# Patient Record
Sex: Female | Born: 1965 | Race: White | Hispanic: No | Smoking: Current every day smoker
Health system: Southern US, Community
[De-identification: ages and names within clinical notes are randomized; demographics above are authoritative.]

## PROBLEM LIST (undated history)

## (undated) DIAGNOSIS — Z8719 Personal history of other diseases of the digestive system: Secondary | ICD-10-CM

## (undated) DIAGNOSIS — I10 Essential (primary) hypertension: Secondary | ICD-10-CM

## (undated) DIAGNOSIS — E119 Type 2 diabetes mellitus without complications: Secondary | ICD-10-CM

## (undated) DIAGNOSIS — Z8711 Personal history of peptic ulcer disease: Secondary | ICD-10-CM

## (undated) HISTORY — PX: KNEE ARTHROSCOPY: SHX127

## (undated) HISTORY — PX: TONSILLECTOMY: SUR1361

## (undated) HISTORY — PX: DILATION AND CURETTAGE OF UTERUS: SHX78

---
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
## 1998-09-05 ENCOUNTER — Other Ambulatory Visit: Admission: RE | Admit: 1998-09-05 | Discharge: 1998-09-05 | Payer: Self-pay | Admitting: Gynecology

## 2003-01-14 HISTORY — PX: VAGINAL HYSTERECTOMY: SUR661

## 2007-12-01 ENCOUNTER — Encounter: Admission: RE | Admit: 2007-12-01 | Discharge: 2007-12-01 | Payer: Self-pay | Admitting: Family Medicine

## 2007-12-14 ENCOUNTER — Encounter: Admission: RE | Admit: 2007-12-14 | Discharge: 2007-12-14 | Payer: Self-pay | Admitting: Family Medicine

## 2009-10-16 IMAGING — MG MM DIAGNOSTIC LTD RIGHT
3 series · 3 of 3 positions shown · non-contrast
Comparison: 12/02/2007

CLINICAL DATA: Asymmetry right breast noted on patient's baseline
screening mammogram of 12/01/2007

DIGITAL DIAGNOSTIC RIGHT MAMMOGRAM WITHOUT CAD

[R CC]
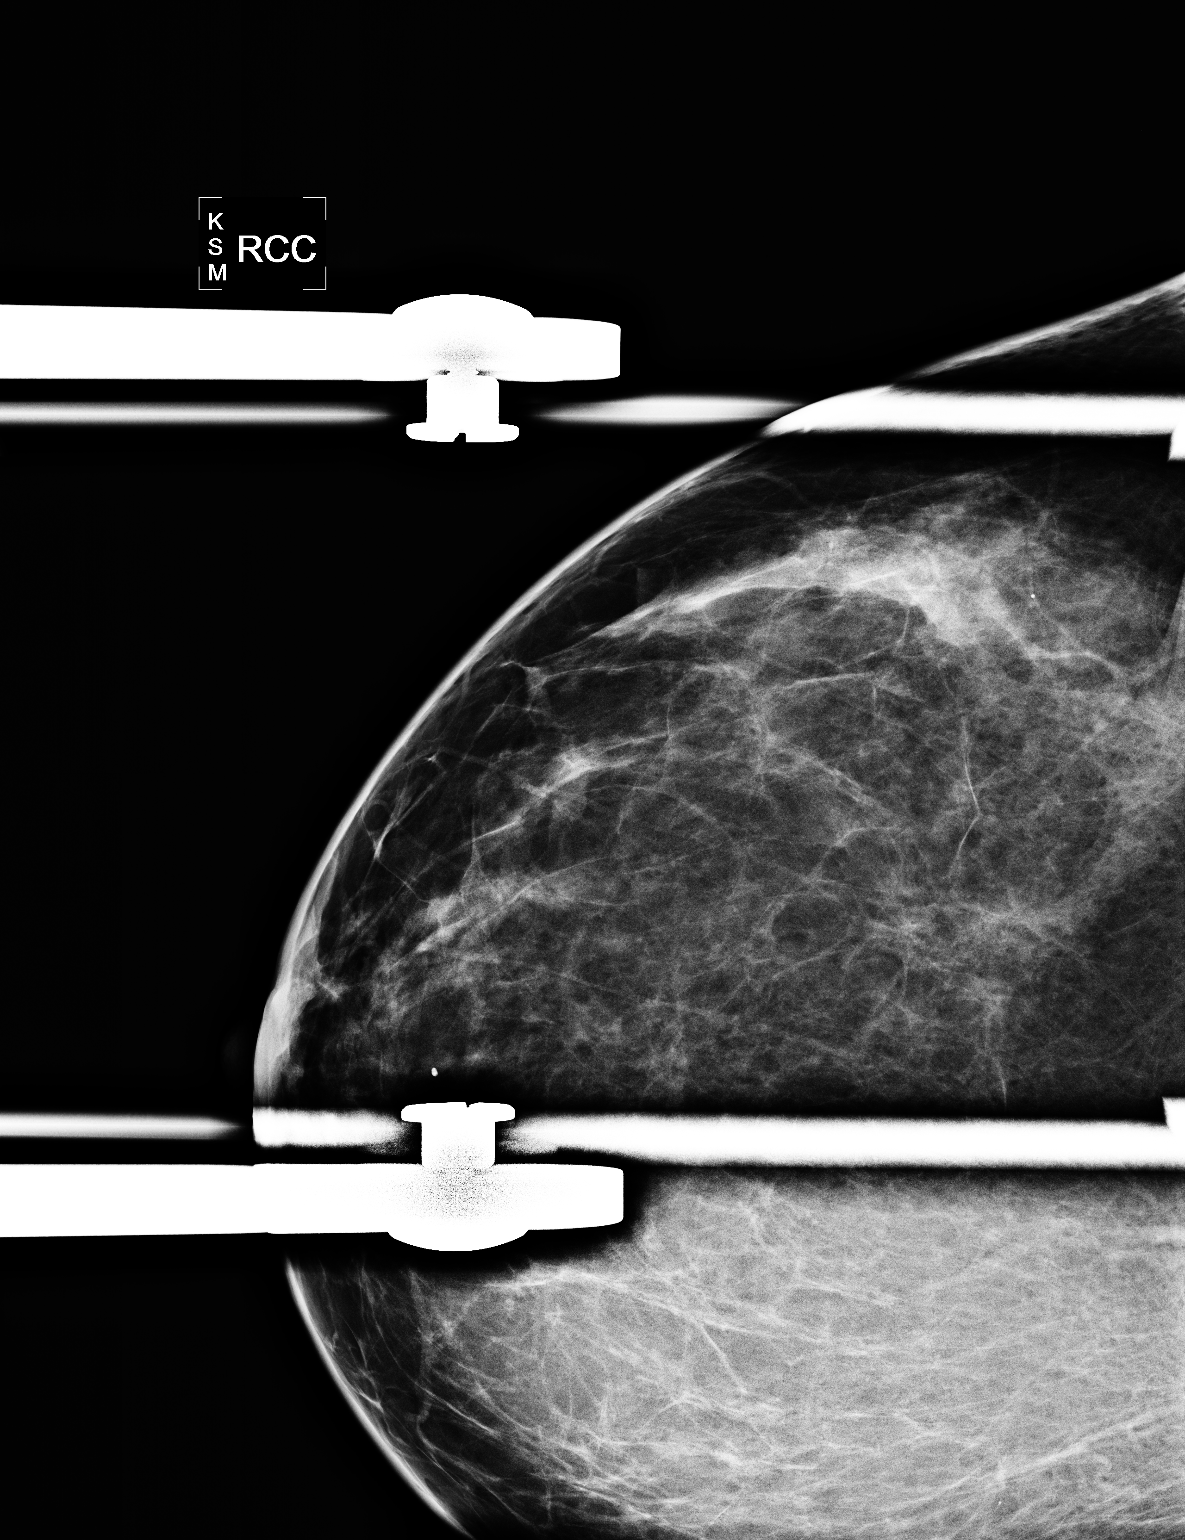

[R MLO]
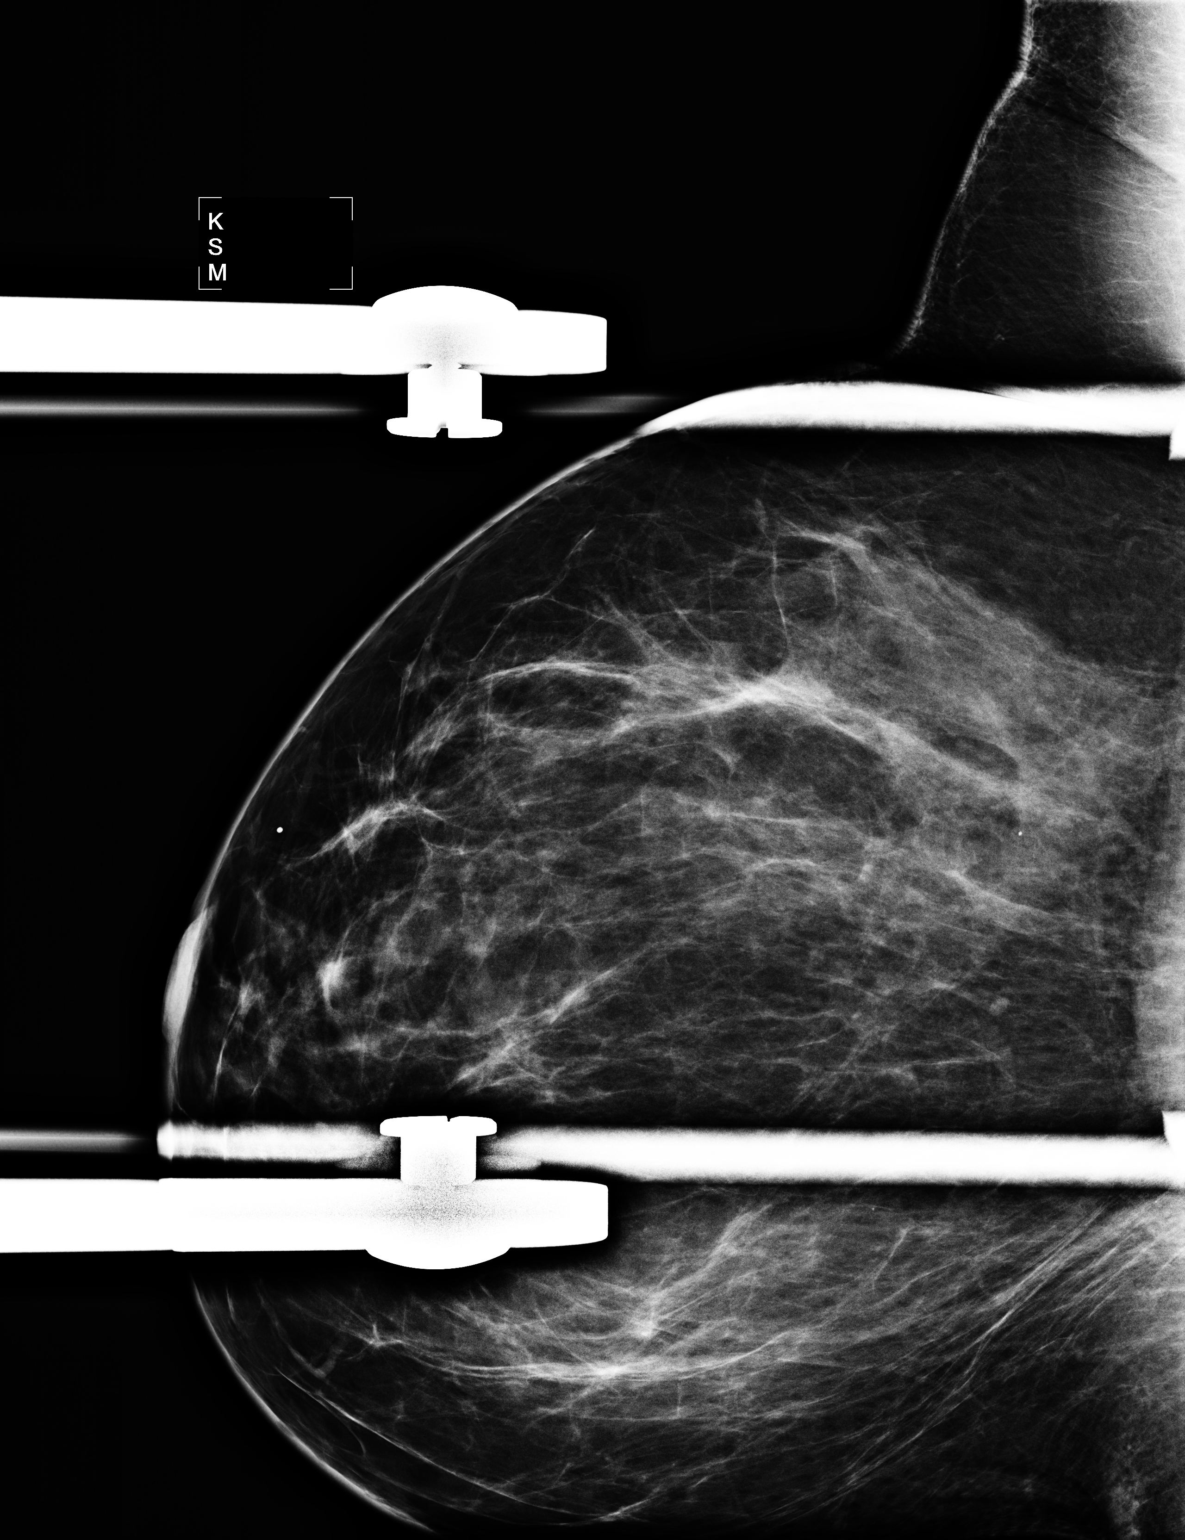

[R ML]
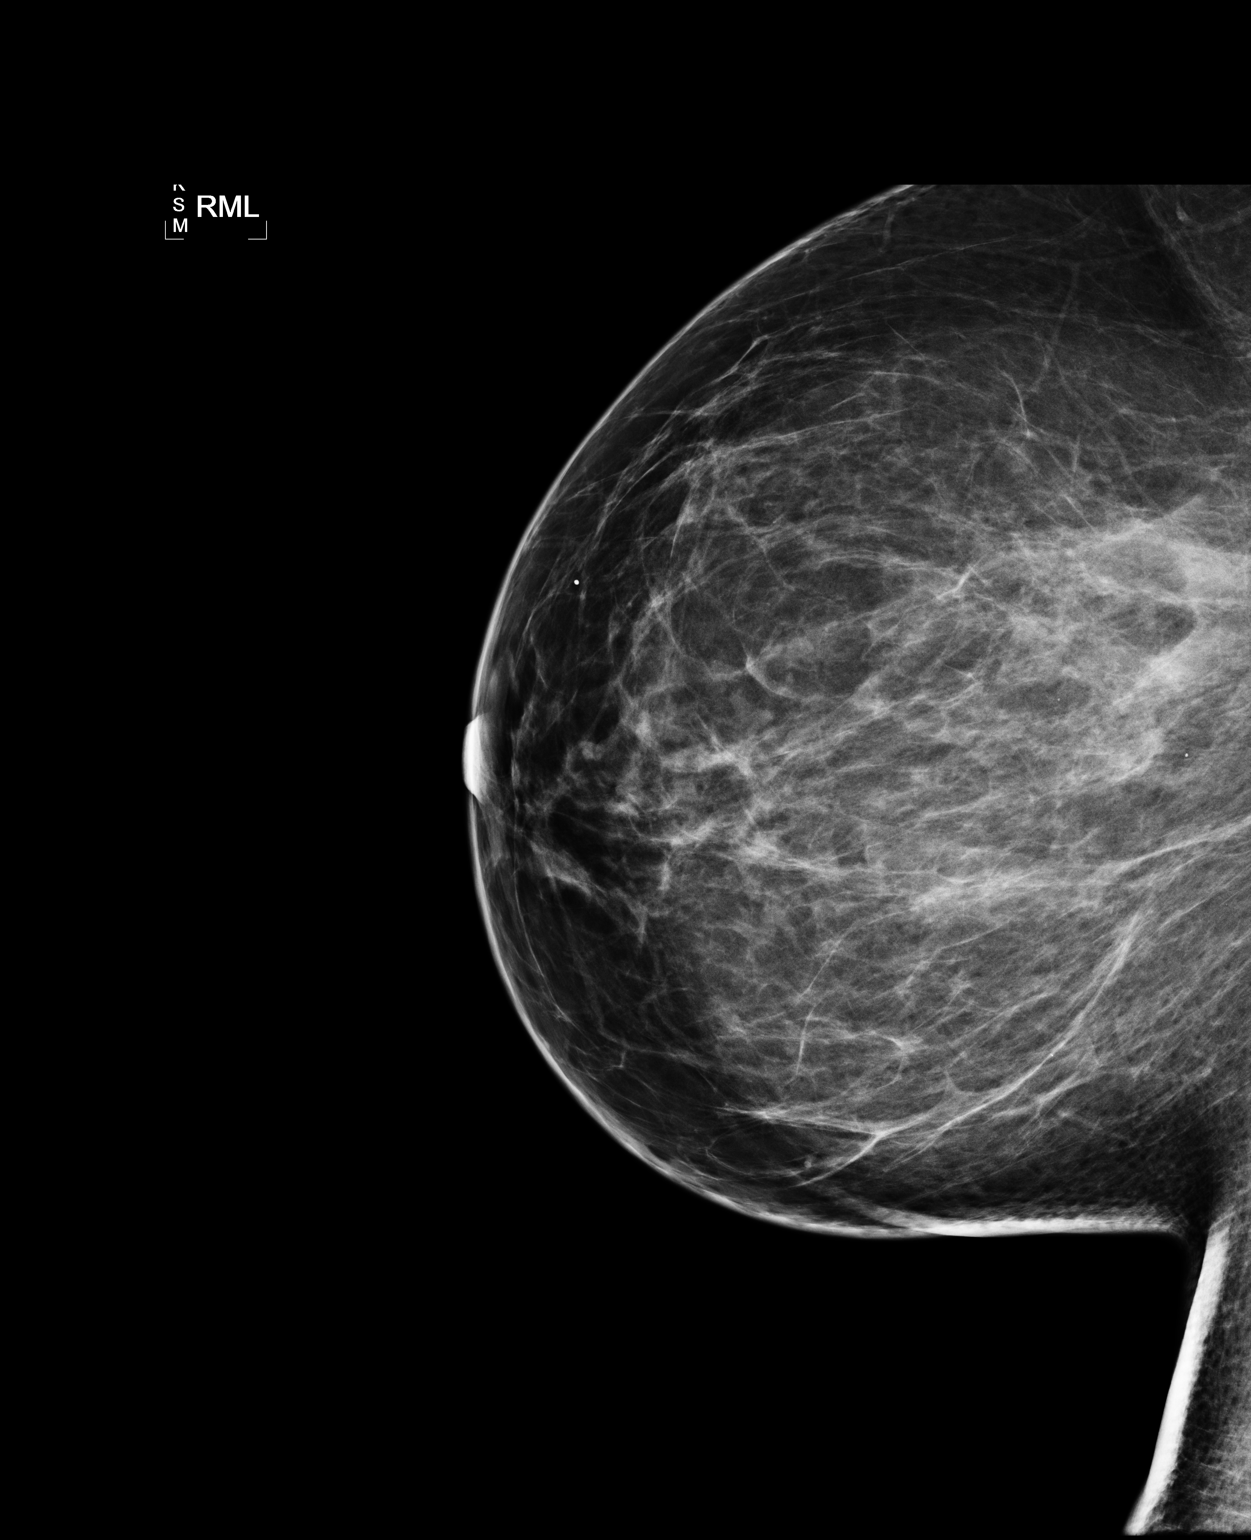

[3 of 3 positions shown; findings below may reference images not displayed]

FINDINGS: A 90 degrees lateral view of the right breast and focal
spot compression views of the right breast show scattered
fibroglandular densities.  There is no evidence of mass or
architectural distortion.  Findings on the screening mammogram are
consistent with overlap of normal fibroglandular breast tissue.
IMPRESSION: No evidence of malignancy in the right breast.  Suggest screening
mammogram of both breasts in 1 year.
BI-RADS CATEGORY 1:  Negative.

## 2010-02-03 ENCOUNTER — Encounter: Payer: Self-pay | Admitting: Family Medicine

## 2014-07-13 ENCOUNTER — Encounter: Payer: Self-pay | Admitting: Physician Assistant

## 2015-04-23 ENCOUNTER — Observation Stay (HOSPITAL_COMMUNITY)
Admission: EM | Admit: 2015-04-23 | Discharge: 2015-04-26 | Disposition: A | Discharge status: Home / Self Care | Payer: BLUE CROSS/BLUE SHIELD | Attending: Internal Medicine | Admitting: Internal Medicine

## 2015-04-23 ENCOUNTER — Emergency Department (HOSPITAL_COMMUNITY): Payer: BLUE CROSS/BLUE SHIELD

## 2015-04-23 ENCOUNTER — Encounter (HOSPITAL_COMMUNITY): Payer: Self-pay | Admitting: Emergency Medicine

## 2015-04-23 DIAGNOSIS — K8010 Calculus of gallbladder with chronic cholecystitis without obstruction: Principal | ICD-10-CM | POA: Insufficient documentation

## 2015-04-23 DIAGNOSIS — Z01818 Encounter for other preprocedural examination: Secondary | ICD-10-CM | POA: Diagnosis present

## 2015-04-23 DIAGNOSIS — I10 Essential (primary) hypertension: Secondary | ICD-10-CM | POA: Insufficient documentation

## 2015-04-23 DIAGNOSIS — R1011 Right upper quadrant pain: Secondary | ICD-10-CM | POA: Diagnosis present

## 2015-04-23 DIAGNOSIS — K8050 Calculus of bile duct without cholangitis or cholecystitis without obstruction: Secondary | ICD-10-CM | POA: Diagnosis present

## 2015-04-23 DIAGNOSIS — Z7984 Long term (current) use of oral hypoglycemic drugs: Secondary | ICD-10-CM | POA: Insufficient documentation

## 2015-04-23 DIAGNOSIS — E876 Hypokalemia: Secondary | ICD-10-CM | POA: Insufficient documentation

## 2015-04-23 DIAGNOSIS — E119 Type 2 diabetes mellitus without complications: Secondary | ICD-10-CM | POA: Insufficient documentation

## 2015-04-23 DIAGNOSIS — K810 Acute cholecystitis: Secondary | ICD-10-CM

## 2015-04-23 DIAGNOSIS — D72829 Elevated white blood cell count, unspecified: Secondary | ICD-10-CM | POA: Diagnosis present

## 2015-04-23 HISTORY — DX: Type 2 diabetes mellitus without complications: E11.9

## 2015-04-23 HISTORY — DX: Personal history of other diseases of the digestive system: Z87.19

## 2015-04-23 HISTORY — DX: Personal history of peptic ulcer disease: Z87.11

## 2015-04-23 HISTORY — DX: Essential (primary) hypertension: I10

## 2015-04-23 LAB — COMPREHENSIVE METABOLIC PANEL
ALBUMIN: 4.3 g/dL (ref 3.5–5.0)
ALK PHOS: 84 U/L (ref 38–126)
ALT: 18 U/L (ref 14–54)
ANION GAP: 13 (ref 5–15)
AST: 17 U/L (ref 15–41)
BUN: 12 mg/dL (ref 6–20)
CALCIUM: 9.8 mg/dL (ref 8.9–10.3)
CHLORIDE: 100 mmol/L — AB (ref 101–111)
CO2: 25 mmol/L (ref 22–32)
Creatinine, Ser: 0.77 mg/dL (ref 0.44–1.00)
GFR calc Af Amer: >60 mL/min (ref >60)
GFR calc non Af Amer: >60 mL/min (ref >60)
GLUCOSE: 177 mg/dL — AB (ref 65–99)
Potassium: 4.4 mmol/L (ref 3.5–5.1)
SODIUM: 138 mmol/L (ref 135–145)
Total Bilirubin: 1 mg/dL (ref 0.3–1.2)
Total Protein: 7.1 g/dL (ref 6.5–8.1)

## 2015-04-23 LAB — CBC
HCT: 41.9 % (ref 36.0–46.0)
HEMOGLOBIN: 14.3 g/dL (ref 12.0–15.0)
MCH: 29.2 pg (ref 26.0–34.0)
MCHC: 34.1 g/dL (ref 30.0–36.0)
MCV: 85.7 fL (ref 78.0–100.0)
Platelets: 250 10*3/uL (ref 150–400)
RBC: 4.89 MIL/uL (ref 3.87–5.11)
RDW: 12.5 % (ref 11.5–15.5)
WBC: 13.1 10*3/uL — AB (ref 4.0–10.5)

## 2015-04-23 LAB — URINE MICROSCOPIC-ADD ON

## 2015-04-23 LAB — URINALYSIS, ROUTINE W REFLEX MICROSCOPIC
Bilirubin Urine: NEGATIVE
GLUCOSE, UA: NEGATIVE mg/dL
Ketones, ur: 15 mg/dL — AB
LEUKOCYTES UA: NEGATIVE
Nitrite: NEGATIVE
Protein, ur: NEGATIVE mg/dL
SPECIFIC GRAVITY, URINE: 1.026 (ref 1.005–1.030)
pH: 5 (ref 5.0–8.0)

## 2015-04-23 LAB — LIPASE, BLOOD: LIPASE: 20 U/L (ref 11–51)

## 2015-04-23 MED ORDER — MORPHINE SULFATE (PF) 4 MG/ML IV SOLN
4.0000 mg | Freq: Once | INTRAVENOUS | Status: DC
  Filled 2015-04-23: qty 1

## 2015-04-23 MED ORDER — OXYCODONE-ACETAMINOPHEN 5-325 MG PO TABS
ORAL_TABLET | ORAL | Status: AC
  Filled 2015-04-23: qty 1

## 2015-04-23 MED ORDER — HYDROMORPHONE HCL 1 MG/ML IJ SOLN
1.0000 mg | Freq: Once | INTRAMUSCULAR | Status: AC
  Administered 2015-04-23: 1 mg via INTRAVENOUS
  Filled 2015-04-23: qty 1

## 2015-04-23 MED ORDER — IOPAMIDOL (ISOVUE-300) INJECTION 61%
INTRAVENOUS | Status: AC
  Filled 2015-04-23: qty 100

## 2015-04-23 MED ORDER — MORPHINE SULFATE (PF) 4 MG/ML IV SOLN
4.0000 mg | Freq: Once | INTRAVENOUS | Status: AC
  Administered 2015-04-23: 4 mg via INTRAMUSCULAR

## 2015-04-23 MED ORDER — OXYCODONE-ACETAMINOPHEN 5-325 MG PO TABS
1.0000 | ORAL_TABLET | ORAL | Status: DC | PRN
  Administered 2015-04-23: 1 via ORAL

## 2015-04-23 MED ORDER — ONDANSETRON HCL 4 MG/2ML IJ SOLN
4.0000 mg | Freq: Once | INTRAMUSCULAR | Status: DC
  Filled 2015-04-23: qty 2

## 2015-04-23 MED ORDER — ONDANSETRON HCL 4 MG/2ML IJ SOLN
4.0000 mg | Freq: Once | INTRAMUSCULAR | Status: AC
  Administered 2015-04-23: 4 mg via INTRAVENOUS
  Filled 2015-04-23: qty 2

## 2015-04-23 MED ORDER — SODIUM CHLORIDE 0.9 % IV BOLUS (SEPSIS)
1000.0000 mL | Freq: Once | INTRAVENOUS | Status: AC
  Administered 2015-04-24: 1000 mL via INTRAVENOUS

## 2015-04-23 MED ORDER — SODIUM CHLORIDE 0.9 % IV SOLN: INTRAVENOUS | Status: DC

## 2015-04-23 MED ORDER — ONDANSETRON 4 MG PO TBDP
4.0000 mg | ORAL_TABLET | Freq: Once | ORAL | Status: AC
  Administered 2015-04-23: 4 mg via ORAL
  Filled 2015-04-23: qty 1

## 2015-04-23 MED ORDER — SODIUM CHLORIDE 0.9 % IV BOLUS (SEPSIS)
1000.0000 mL | Freq: Once | INTRAVENOUS | Status: AC
Start: 2015-04-23 — End: 2015-04-24
  Administered 2015-04-23: 1000 mL via INTRAVENOUS

## 2015-04-23 NOTE — ED Notes (Signed)
Pt was given 8mg  of Zofran at urgent care.

## 2015-04-23 NOTE — ED Provider Notes (Signed)
CSN: [id number]     Arrival date & time 04/23/15  1615 History   First MD Initiated Contact with Patient 04/23/15 2003     Chief Complaint  Patient presents with  . Abdominal Pain     (Consider location/radiation/quality/duration/timing/severity/associated sxs/prior Treatment) HPI   Taylor Gibbs is a 50 y.o. female, with a history of DM and hypertension, presenting to the ED with Right upper quadrant pain that began at 4 AM this morning, accompanied by nausea and vomiting. Patient is sent for instances of emesis since her complaint began. Patient states that she has had a similar pain intermittently for the last couple months, but it would always resolved. Patient denies any noticeable pattern with the previous instances of pain. Patient also endorses subjective fever accompanied by chills. She rates her pain at 10 out of 10, states the pain is hard to describe, and radiates to her right lower back. Patient states that she tried to take Tylenol with Codeine, left over from previous dental procedure, without relief. Patient was first seen at urgent care today and sent here to the ED. Patient denies diarrhea, trauma, urinary complaints, or any other complaints.    Past Medical History  Diagnosis Date  . Diabetes mellitus without complication (HCC)   . Hypertension    Past Surgical History  Procedure Laterality Date  . Abdominal hysterectomy    . Ovarian cyst removal     No family history on file. Social History  Substance Use Topics  . Smoking status: Current Every Day Smoker -- 0.50 packs/day    Types: Cigarettes  . Smokeless tobacco: None  . Alcohol Use: No   OB History    No data available     Review of Systems  Constitutional: Positive for fever (subjective) and chills.  Cardiovascular: Negative for chest pain.  Gastrointestinal: Positive for nausea, vomiting and abdominal pain. Negative for diarrhea, constipation and blood in stool.  Genitourinary: Negative for dysuria,  hematuria, vaginal bleeding and vaginal discharge.  Musculoskeletal: Positive for back pain.  Skin: Negative for color change and pallor.  All other systems reviewed and are negative.     Allergies  Review of patient's allergies indicates no known allergies.  Home Medications   Prior to Admission medications   Medication Sig Start Date End Date Taking? Authorizing Provider  aspirin EC 81 MG tablet Take 81 mg by mouth at bedtime.   Yes Historical Provider, MD  ibuprofen (ADVIL,MOTRIN) 200 MG tablet Take 400 mg by mouth every 6 (six) hours as needed.   Yes Historical Provider, MD  lisinopril-hydrochlorothiazide (PRINZIDE,ZESTORETIC) 20-12.5 MG tablet Take 1 tablet by mouth daily. 04/09/15  Yes Historical Provider, MD  metFORMIN (GLUCOPHAGE) 500 MG tablet Take 500 mg by mouth 2 (two) times daily. 04/09/15  Yes Historical Provider, MD  triamcinolone cream (KENALOG) 0.1 % Apply 1 application topically 2 (two) times daily. 02/21/15  Yes Historical Provider, MD  acetaminophen-codeine (TYLENOL #3) 300-30 MG tablet Take 1 tablet by mouth once.    Historical Provider, MD   BP 150/88 mmHg  Pulse 81  Temp(Src) 98 F (36.7 C) (Oral)  Resp 20  Ht  (1.702 m)  Wt 74.844 kg  BMI 25.84 kg/m2  SpO2 98% Physical Exam  Constitutional: She appears well-developed and well-nourished. No distress.  HENT:  Head: Normocephalic and atraumatic.  Eyes: Conjunctivae are normal. Pupils are equal, round, and reactive to light.  Neck: Neck supple.  Cardiovascular: Normal rate, regular rhythm, normal heart sounds and intact distal  pulses.   Pulmonary/Chest: Effort normal and breath sounds normal. No respiratory distress.  Abdominal: Soft. Normal appearance. There is tenderness in the right upper quadrant. There is no guarding and no CVA tenderness.  Musculoskeletal: She exhibits no edema or tenderness.  Lymphadenopathy:    She has no cervical adenopathy.  Neurological: She is alert.  Skin: Skin is warm and  dry. She is not diaphoretic.  Psychiatric: She has a normal mood and affect. Her behavior is normal.  Nursing note and vitals reviewed.   ED Course  Procedures (including critical care time) Labs Review Labs Reviewed  COMPREHENSIVE METABOLIC PANEL - Abnormal; Notable for the following:    Chloride 100 (*)    Glucose, Bld 177 (*)    All other components within normal limits  CBC - Abnormal; Notable for the following:    WBC 13.1 (*)    All other components within normal limits  URINALYSIS, ROUTINE W REFLEX MICROSCOPIC (NOT AT [facility]) - Abnormal; Notable for the following:    Hgb urine dipstick SMALL (*)    Ketones, ur 15 (*)    All other components within normal limits  URINE MICROSCOPIC-ADD ON - Abnormal; Notable for the following:    Squamous Epithelial / LPF 0-5 (*)    Bacteria, UA RARE (*)    Casts HYALINE CASTS (*)    All other components within normal limits  LIPASE, BLOOD    Imaging Review US Abdomen Limited Ruq  04/23/2015  CLINICAL DATA:  One day history of right upper quadrant pain with vomiting EXAM: US ABDOMEN LIMITED - RIGHT UPPER QUADRANT COMPARISON:  None. FINDINGS: Gallbladder: There is a calculus measuring 1.6 cm in length which is noted in the neck of the gallbladder and appears adherent. This calculus does shadow. There is no gallbladder wall thickening or pericholecystic fluid. No sonographic Murphy sign noted by sonographer. Common bile duct: Diameter: 3 mm. There is no intrahepatic or extrahepatic biliary duct dilatation. Liver: No focal lesion identified. Within normal limits in parenchymal echogenicity. IMPRESSION: Calculus adherent in the neck of the gallbladder. Study otherwise unremarkable. Electronically Signed   By: Bretta Bang III M.D.   On: 04/23/2015 21:36   I have personally reviewed and evaluated these images and lab results as part of my medical decision-making.   EKG Interpretation None       Medications  oxyCODONE-acetaminophen  (PERCOCET/ROXICET) 5-325 MG per tablet 1 tablet (1 tablet Oral Given 04/23/15 1627)  oxyCODONE-acetaminophen (PERCOCET/ROXICET) 5-325 MG per tablet (not administered)  sodium chloride 0.9 % bolus 1,000 mL (1,000 mLs Intravenous New Bag/Given 04/23/15 2211)    And  sodium chloride 0.9 % bolus 1,000 mL (not administered)    And  0.9 %  sodium chloride infusion (not administered)  ondansetron (ZOFRAN-ODT) disintegrating tablet 4 mg (4 mg Oral Given 04/23/15 2259)  morphine 4 MG/ML injection 4 mg (4 mg Intramuscular Given 04/23/15 2253)  HYDROmorphone (DILAUDID) injection 1 mg (1 mg Intravenous Given 04/23/15 2352)  ondansetron (ZOFRAN) injection 4 mg (4 mg Intravenous Given 04/23/15 2352)    MDM   Final diagnoses:  Biliary colic    Birdell E Crunkleton presents with right upper quadrant pain that began at 4 AM this morning, accompanied by nausea and vomiting and subjective fever.  Findings and plan of care discussed with Lyndal Pulley, MD. Dr. Clydene Pugh personally evaluated and examined this patient. This patient was also discussed with Ross Marcus, MD after EDP shift change.   This patient's story and presentation is suspicious  for biliary colic. Labs, imaging, IV fluids, and analgesia ordered. Patient is afebrile here, however, this may be due to her recent Tylenol use. Patient's ultrasound findings suggest that the patient may be suffering from symptomatic cholelithiasis and, combined with her leukocytosis, may indicate an early cholecystitis. Pt has poor IV access. Medications to be given orally and IM. Should patient's pain improved, we will discharge patient with Zofran, oxycodone, and instructions to follow-up with [facility] [place] surgery outpatient. If patient's pain cannot be controlled, contact general surgery for a possible early cholecystitis. Ask them to come evaluate the patient. Upon reassessment, patient states that her pain has not improved. 12:47 AM Patient's pain continues to be  uncontrollable after Percocet, morphine, and Dilaudid. Patient's pain has only reduced from a 10/10 to an 8/10. General surgery consult placed due to the inability to control the patient's symptoms. 12:50 AM General Surgery Paged - Faera Byerly 1:25 AM Dr. Donell BeersByerly repaged 2:06 AM Patient care signed out to Dr. Wilkie AyeHorton.   Filed Vitals:   04/23/15 2000 04/23/15 2015 04/23/15 2045 04/23/15 2215  BP: 165/98 164/99 162/81 156/90  Pulse: 60 62 63 65  Temp:      TempSrc:      Resp: 14 19 17 13   Height:      Weight:      SpO2: 100% 100% 100% 100%   Filed Vitals:   04/24/15 0000 04/24/15 0015 04/24/15 0030 04/24/15 0045  BP: 142/90 143/82 148/89 148/86  Pulse: 83 79 78 77  Temp:      TempSrc:      Resp: 15 14 14 15   Height:      Weight:      SpO2: 92% 97% 97% 96%       Anselm PancoastShawn C Shedric Fredericks, PA-C 04/24/15 0209  Lyndal Pulleyaniel Knott, MD 04/24/15 [phone]

## 2015-04-23 NOTE — ED Notes (Signed)
Patient transported to Ultrasound 

## 2015-04-23 NOTE — ED Notes (Signed)
Pt states she had a sudden onset of RUQ pain at 0400 this am with n/v. Vomiting x4. Pain is also radiating into her mid back.

## 2015-04-23 NOTE — ED Notes (Signed)
2 RNs attempted IV without success. IV team consulted. 

## 2015-04-24 ENCOUNTER — Encounter (HOSPITAL_COMMUNITY)
Admission: EM | Disposition: A | Discharge status: Home / Self Care | Payer: Self-pay | Source: Home / Self Care | Attending: Internal Medicine

## 2015-04-24 ENCOUNTER — Inpatient Hospital Stay (HOSPITAL_COMMUNITY): Payer: BLUE CROSS/BLUE SHIELD

## 2015-04-24 ENCOUNTER — Encounter (HOSPITAL_COMMUNITY): Payer: Self-pay | Admitting: Emergency Medicine

## 2015-04-24 ENCOUNTER — Inpatient Hospital Stay (HOSPITAL_COMMUNITY): Payer: BLUE CROSS/BLUE SHIELD | Admitting: Certified Registered Nurse Anesthetist

## 2015-04-24 DIAGNOSIS — K8050 Calculus of bile duct without cholangitis or cholecystitis without obstruction: Secondary | ICD-10-CM | POA: Diagnosis not present

## 2015-04-24 DIAGNOSIS — E119 Type 2 diabetes mellitus without complications: Secondary | ICD-10-CM | POA: Diagnosis not present

## 2015-04-24 DIAGNOSIS — D72829 Elevated white blood cell count, unspecified: Secondary | ICD-10-CM | POA: Diagnosis not present

## 2015-04-24 DIAGNOSIS — I10 Essential (primary) hypertension: Secondary | ICD-10-CM | POA: Diagnosis not present

## 2015-04-24 HISTORY — PX: LAPAROSCOPIC CHOLECYSTECTOMY: SUR755

## 2015-04-24 HISTORY — PX: CHOLECYSTECTOMY: SHX55

## 2015-04-24 LAB — CBC
HCT: 34.8 % — ABNORMAL LOW (ref 36.0–46.0)
HEMOGLOBIN: 11.7 g/dL — AB (ref 12.0–15.0)
MCH: 28.8 pg (ref 26.0–34.0)
MCHC: 33.6 g/dL (ref 30.0–36.0)
MCV: 85.7 fL (ref 78.0–100.0)
PLATELETS: 178 10*3/uL (ref 150–400)
RBC: 4.06 MIL/uL (ref 3.87–5.11)
RDW: 12.7 % (ref 11.5–15.5)
WBC: 14.8 10*3/uL — ABNORMAL HIGH (ref 4.0–10.5)

## 2015-04-24 LAB — PROTIME-INR
INR: 1.06 (ref 0.00–1.49)
PROTHROMBIN TIME: 14 s (ref 11.6–15.2)

## 2015-04-24 LAB — COMPREHENSIVE METABOLIC PANEL
ALBUMIN: 3 g/dL — AB (ref 3.5–5.0)
ALK PHOS: 61 U/L (ref 38–126)
ALT: 13 U/L — AB (ref 14–54)
ANION GAP: 10 (ref 5–15)
AST: 14 U/L — ABNORMAL LOW (ref 15–41)
BILIRUBIN TOTAL: 0.8 mg/dL (ref 0.3–1.2)
BUN: 8 mg/dL (ref 6–20)
CALCIUM: 7.8 mg/dL — AB (ref 8.9–10.3)
CO2: 22 mmol/L (ref 22–32)
CREATININE: 0.68 mg/dL (ref 0.44–1.00)
Chloride: 107 mmol/L (ref 101–111)
GFR calc Af Amer: >60 mL/min (ref >60)
GFR calc non Af Amer: >60 mL/min (ref >60)
GLUCOSE: 153 mg/dL — AB (ref 65–99)
Potassium: 3.6 mmol/L (ref 3.5–5.1)
SODIUM: 139 mmol/L (ref 135–145)
Total Protein: 5.4 g/dL — ABNORMAL LOW (ref 6.5–8.1)

## 2015-04-24 LAB — GLUCOSE, CAPILLARY
GLUCOSE-CAPILLARY: 148 mg/dL — AB (ref 65–99)
GLUCOSE-CAPILLARY: 152 mg/dL — AB (ref 65–99)
Glucose-Capillary: 112 mg/dL — ABNORMAL HIGH (ref 65–99)
Glucose-Capillary: 129 mg/dL — ABNORMAL HIGH (ref 65–99)

## 2015-04-24 LAB — APTT: aPTT: 32 seconds (ref 24–37)

## 2015-04-24 SURGERY — LAPAROSCOPIC CHOLECYSTECTOMY WITH INTRAOPERATIVE CHOLANGIOGRAM
Anesthesia: General | Site: Abdomen

## 2015-04-24 MED ORDER — FENTANYL CITRATE (PF) 100 MCG/2ML IJ SOLN
INTRAMUSCULAR | Status: DC | PRN
  Administered 2015-04-24 (×2): 50 ug via INTRAVENOUS
  Administered 2015-04-24 (×2): 100 ug via INTRAVENOUS
  Administered 2015-04-24: 50 ug via INTRAVENOUS

## 2015-04-24 MED ORDER — SUGAMMADEX SODIUM 200 MG/2ML IV SOLN
INTRAVENOUS | Status: DC | PRN
  Administered 2015-04-24: 200 mg via INTRAVENOUS

## 2015-04-24 MED ORDER — ROCURONIUM BROMIDE 50 MG/5ML IV SOLN
INTRAVENOUS | Status: AC
  Filled 2015-04-24: qty 1

## 2015-04-24 MED ORDER — KETOROLAC TROMETHAMINE 30 MG/ML IJ SOLN
INTRAMUSCULAR | Status: AC
  Filled 2015-04-24: qty 1

## 2015-04-24 MED ORDER — ONDANSETRON HCL 4 MG/2ML IJ SOLN
INTRAMUSCULAR | Status: AC
  Filled 2015-04-24: qty 2

## 2015-04-24 MED ORDER — HYDROCHLOROTHIAZIDE 12.5 MG PO CAPS
12.5000 mg | ORAL_CAPSULE | Freq: Every day | ORAL | Status: DC
  Administered 2015-04-24 – 2015-04-26 (×2): 12.5 mg via ORAL
  Filled 2015-04-24 (×3): qty 1

## 2015-04-24 MED ORDER — ROCURONIUM BROMIDE 100 MG/10ML IV SOLN
INTRAVENOUS | Status: DC | PRN
  Administered 2015-04-24: 20 mg via INTRAVENOUS
  Administered 2015-04-24: 10 mg via INTRAVENOUS
  Administered 2015-04-24: 20 mg via INTRAVENOUS
  Administered 2015-04-24: 50 mg via INTRAVENOUS

## 2015-04-24 MED ORDER — ONDANSETRON HCL 4 MG/2ML IJ SOLN: 4.0000 mg | Freq: Four times a day (QID) | INTRAMUSCULAR | Status: DC | PRN

## 2015-04-24 MED ORDER — ENOXAPARIN SODIUM 40 MG/0.4ML ~~LOC~~ SOLN
40.0000 mg | SUBCUTANEOUS | Status: DC
  Administered 2015-04-25 – 2015-04-26 (×2): 40 mg via SUBCUTANEOUS
  Filled 2015-04-24 (×2): qty 0.4

## 2015-04-24 MED ORDER — BUPIVACAINE-EPINEPHRINE 0.25% -1:200000 IJ SOLN
INTRAMUSCULAR | Status: DC | PRN
  Administered 2015-04-24: 30 mL

## 2015-04-24 MED ORDER — FENTANYL CITRATE (PF) 250 MCG/5ML IJ SOLN
INTRAMUSCULAR | Status: AC
  Filled 2015-04-24: qty 5

## 2015-04-24 MED ORDER — ONDANSETRON HCL 4 MG PO TABS
4.0000 mg | ORAL_TABLET | Freq: Four times a day (QID) | ORAL | Status: DC | PRN
Start: 2015-04-24 — End: 2015-04-27

## 2015-04-24 MED ORDER — MIDAZOLAM HCL 2 MG/2ML IJ SOLN
INTRAMUSCULAR | Status: AC
  Filled 2015-04-24: qty 2

## 2015-04-24 MED ORDER — HYDROMORPHONE HCL 1 MG/ML IJ SOLN
0.5000 mg | INTRAMUSCULAR | Status: DC | PRN
Start: 2015-04-24 — End: 2015-04-24
  Administered 2015-04-24: 1 mg via INTRAVENOUS
  Filled 2015-04-24: qty 1

## 2015-04-24 MED ORDER — PHENYLEPHRINE 40 MCG/ML (10ML) SYRINGE FOR IV PUSH (FOR BLOOD PRESSURE SUPPORT)
PREFILLED_SYRINGE | INTRAVENOUS | Status: AC
  Filled 2015-04-24: qty 10

## 2015-04-24 MED ORDER — SODIUM CHLORIDE 0.9 % IR SOLN
Status: DC | PRN
  Administered 2015-04-24 (×2): 1000 mL

## 2015-04-24 MED ORDER — DIPHENHYDRAMINE HCL 12.5 MG/5ML PO ELIX: 12.5000 mg | ORAL_SOLUTION | Freq: Four times a day (QID) | ORAL | Status: DC | PRN

## 2015-04-24 MED ORDER — DEXTROSE 5 % IV SOLN
1.0000 g | Freq: Once | INTRAVENOUS | Status: AC
  Administered 2015-04-24: 1 g via INTRAVENOUS
  Filled 2015-04-24: qty 10

## 2015-04-24 MED ORDER — IOPAMIDOL (ISOVUE-300) INJECTION 61%
INTRAVENOUS | Status: DC | PRN
  Administered 2015-04-24: 12 mL

## 2015-04-24 MED ORDER — LACTATED RINGERS IV SOLN
INTRAVENOUS | Status: DC
  Administered 2015-04-24 (×2): via INTRAVENOUS

## 2015-04-24 MED ORDER — LACTATED RINGERS IV SOLN: INTRAVENOUS | Status: DC

## 2015-04-24 MED ORDER — PROPOFOL 10 MG/ML IV BOLUS
INTRAVENOUS | Status: DC | PRN
  Administered 2015-04-24: 170 mg via INTRAVENOUS

## 2015-04-24 MED ORDER — SODIUM CHLORIDE 0.9 % IV SOLN
INTRAVENOUS | Status: DC
  Administered 2015-04-24 – 2015-04-25 (×2): via INTRAVENOUS

## 2015-04-24 MED ORDER — OXYCODONE HCL 5 MG/5ML PO SOLN: 5.0000 mg | Freq: Once | ORAL | Status: DC | PRN

## 2015-04-24 MED ORDER — SENNOSIDES-DOCUSATE SODIUM 8.6-50 MG PO TABS: 1.0000 | ORAL_TABLET | Freq: Every evening | ORAL | Status: DC | PRN

## 2015-04-24 MED ORDER — LIDOCAINE HCL (CARDIAC) 20 MG/ML IV SOLN
INTRAVENOUS | Status: AC
  Filled 2015-04-24: qty 5

## 2015-04-24 MED ORDER — LIDOCAINE HCL (CARDIAC) 20 MG/ML IV SOLN
INTRAVENOUS | Status: DC | PRN
  Administered 2015-04-24: 70 mg via INTRAVENOUS

## 2015-04-24 MED ORDER — DEXTROSE 5 % IV SOLN
1.0000 g | INTRAVENOUS | Status: DC
  Administered 2015-04-24: 1 g via INTRAVENOUS
  Filled 2015-04-24 (×2): qty 10

## 2015-04-24 MED ORDER — MIDAZOLAM HCL 5 MG/5ML IJ SOLN
INTRAMUSCULAR | Status: DC | PRN
  Administered 2015-04-24: 2 mg via INTRAVENOUS

## 2015-04-24 MED ORDER — INSULIN ASPART 100 UNIT/ML ~~LOC~~ SOLN
0.0000 [IU] | Freq: Three times a day (TID) | SUBCUTANEOUS | Status: DC
  Administered 2015-04-24: 3 [IU] via SUBCUTANEOUS
  Administered 2015-04-26 (×2): 2 [IU] via SUBCUTANEOUS

## 2015-04-24 MED ORDER — ACETAMINOPHEN 325 MG PO TABS
650.0000 mg | ORAL_TABLET | Freq: Four times a day (QID) | ORAL | Status: DC | PRN
  Administered 2015-04-26: 650 mg via ORAL
  Filled 2015-04-24: qty 2

## 2015-04-24 MED ORDER — OXYCODONE HCL 5 MG PO TABS
5.0000 mg | ORAL_TABLET | ORAL | Status: DC | PRN
  Administered 2015-04-24 – 2015-04-26 (×7): 10 mg via ORAL
  Filled 2015-04-24 (×7): qty 2

## 2015-04-24 MED ORDER — PANTOPRAZOLE SODIUM 40 MG IV SOLR
40.0000 mg | Freq: Every day | INTRAVENOUS | Status: DC
  Administered 2015-04-24 – 2015-04-25 (×2): 40 mg via INTRAVENOUS
  Filled 2015-04-24 (×2): qty 40

## 2015-04-24 MED ORDER — DEXAMETHASONE SODIUM PHOSPHATE 10 MG/ML IJ SOLN
INTRAMUSCULAR | Status: DC | PRN
  Administered 2015-04-24: 10 mg via INTRAVENOUS

## 2015-04-24 MED ORDER — ONDANSETRON HCL 4 MG/2ML IJ SOLN
4.0000 mg | Freq: Four times a day (QID) | INTRAMUSCULAR | Status: DC | PRN
  Administered 2015-04-24: 4 mg via INTRAVENOUS

## 2015-04-24 MED ORDER — ESMOLOL HCL 100 MG/10ML IV SOLN
INTRAVENOUS | Status: DC | PRN
  Administered 2015-04-24: 40 mg via INTRAVENOUS

## 2015-04-24 MED ORDER — PHENYLEPHRINE HCL 10 MG/ML IJ SOLN
INTRAMUSCULAR | Status: DC | PRN
  Administered 2015-04-24 (×4): 120 ug via INTRAVENOUS

## 2015-04-24 MED ORDER — IOPAMIDOL (ISOVUE-300) INJECTION 61%
INTRAVENOUS | Status: AC
  Filled 2015-04-24: qty 50

## 2015-04-24 MED ORDER — 0.9 % SODIUM CHLORIDE (POUR BTL) OPTIME
TOPICAL | Status: DC | PRN
  Administered 2015-04-24: 1000 mL

## 2015-04-24 MED ORDER — BUPIVACAINE-EPINEPHRINE (PF) 0.25% -1:200000 IJ SOLN
INTRAMUSCULAR | Status: AC
  Filled 2015-04-24: qty 30

## 2015-04-24 MED ORDER — METRONIDAZOLE IN NACL 5-0.79 MG/ML-% IV SOLN
500.0000 mg | Freq: Three times a day (TID) | INTRAVENOUS | Status: DC
  Administered 2015-04-24: 500 mg via INTRAVENOUS
  Filled 2015-04-24 (×4): qty 100

## 2015-04-24 MED ORDER — OXYCODONE HCL 5 MG PO TABS: 5.0000 mg | ORAL_TABLET | Freq: Once | ORAL | Status: DC | PRN

## 2015-04-24 MED ORDER — ACETAMINOPHEN 650 MG RE SUPP: 650.0000 mg | Freq: Four times a day (QID) | RECTAL | Status: DC | PRN

## 2015-04-24 MED ORDER — INSULIN ASPART 100 UNIT/ML ~~LOC~~ SOLN: 0.0000 [IU] | SUBCUTANEOUS | Status: DC

## 2015-04-24 MED ORDER — HYDROMORPHONE HCL 1 MG/ML IJ SOLN: 0.2500 mg | INTRAMUSCULAR | Status: DC | PRN

## 2015-04-24 MED ORDER — ASPIRIN EC 81 MG PO TBEC: 81.0000 mg | DELAYED_RELEASE_TABLET | Freq: Every day | ORAL | Status: DC

## 2015-04-24 MED ORDER — HYDROMORPHONE HCL 1 MG/ML IJ SOLN: 1.0000 mg | INTRAMUSCULAR | Status: DC | PRN

## 2015-04-24 MED ORDER — SUGAMMADEX SODIUM 200 MG/2ML IV SOLN
INTRAVENOUS | Status: AC
Start: 2015-04-24 — End: 2015-04-24
  Filled 2015-04-24: qty 2

## 2015-04-24 MED ORDER — DIPHENHYDRAMINE HCL 50 MG/ML IJ SOLN
12.5000 mg | Freq: Four times a day (QID) | INTRAMUSCULAR | Status: DC | PRN
  Administered 2015-04-25: 12.5 mg via INTRAVENOUS
  Filled 2015-04-24: qty 1

## 2015-04-24 MED ORDER — HYDROMORPHONE HCL 1 MG/ML IJ SOLN
0.5000 mg | INTRAMUSCULAR | Status: DC | PRN
  Administered 2015-04-24 (×2): 1 mg via INTRAVENOUS
  Administered 2015-04-25: 2 mg via INTRAVENOUS
  Administered 2015-04-25 (×3): 1 mg via INTRAVENOUS
  Administered 2015-04-25: 2 mg via INTRAVENOUS
  Administered 2015-04-26: 1 mg via INTRAVENOUS
  Filled 2015-04-24: qty 2
  Filled 2015-04-24 (×6): qty 1
  Filled 2015-04-24: qty 2

## 2015-04-24 MED ORDER — METRONIDAZOLE IN NACL 5-0.79 MG/ML-% IV SOLN
500.0000 mg | Freq: Three times a day (TID) | INTRAVENOUS | Status: AC
Start: 2015-04-24 — End: 2015-04-24
  Administered 2015-04-24: 500 mg via INTRAVENOUS
  Filled 2015-04-24: qty 100

## 2015-04-24 SURGICAL SUPPLY — 50 items
APPLIER CLIP 5 13 M/L LIGAMAX5 (MISCELLANEOUS) ×2
BANDAGE ADH SHEER 1  50/CT (GAUZE/BANDAGES/DRESSINGS) ×6 IMPLANT
BENZOIN TINCTURE PRP APPL 2/3 (GAUZE/BANDAGES/DRESSINGS) ×2 IMPLANT
BLADE SURG ROTATE 9660 (MISCELLANEOUS) IMPLANT
CANISTER SUCTION 2500CC (MISCELLANEOUS) ×2 IMPLANT
CHLORAPREP W/TINT 26ML (MISCELLANEOUS) ×2 IMPLANT
CLIP APPLIE 5 13 M/L LIGAMAX5 (MISCELLANEOUS) ×1 IMPLANT
COVER MAYO STAND STRL (DRAPES) ×2 IMPLANT
COVER SURGICAL LIGHT HANDLE (MISCELLANEOUS) ×2 IMPLANT
DRAPE C-ARM 42X72 X-RAY (DRAPES) ×2 IMPLANT
DRSG TEGADERM 4X4.75 (GAUZE/BANDAGES/DRESSINGS) ×2 IMPLANT
ELECT REM PT RETURN 9FT ADLT (ELECTROSURGICAL) ×2
ELECTRODE REM PT RTRN 9FT ADLT (ELECTROSURGICAL) ×1 IMPLANT
GAUZE SPONGE 2X2 8PLY STRL LF (GAUZE/BANDAGES/DRESSINGS) ×1 IMPLANT
GLOVE BIO SURGEON STRL SZ8 (GLOVE) ×2 IMPLANT
GLOVE BIOGEL M STRL SZ7.5 (GLOVE) ×2 IMPLANT
GLOVE BIOGEL PI IND STRL 7.5 (GLOVE) ×1 IMPLANT
GLOVE BIOGEL PI IND STRL 8 (GLOVE) ×2 IMPLANT
GLOVE BIOGEL PI IND STRL 8.5 (GLOVE) ×1 IMPLANT
GLOVE BIOGEL PI INDICATOR 7.5 (GLOVE) ×1
GLOVE BIOGEL PI INDICATOR 8 (GLOVE) ×2
GLOVE BIOGEL PI INDICATOR 8.5 (GLOVE) ×1
GLOVE SURG SS PI 7.5 STRL IVOR (GLOVE) ×4 IMPLANT
GOWN STRL REUS W/ TWL LRG LVL3 (GOWN DISPOSABLE) ×3 IMPLANT
GOWN STRL REUS W/ TWL XL LVL3 (GOWN DISPOSABLE) ×1 IMPLANT
GOWN STRL REUS W/TWL LRG LVL3 (GOWN DISPOSABLE) ×3
GOWN STRL REUS W/TWL XL LVL3 (GOWN DISPOSABLE) ×1
KIT BASIN OR (CUSTOM PROCEDURE TRAY) ×2 IMPLANT
KIT ROOM TURNOVER OR (KITS) ×2 IMPLANT
NS IRRIG 1000ML POUR BTL (IV SOLUTION) ×2 IMPLANT
PAD ARMBOARD 7.5X6 YLW CONV (MISCELLANEOUS) ×2 IMPLANT
POUCH RETRIEVAL ECOSAC 10 (ENDOMECHANICALS) ×1 IMPLANT
POUCH RETRIEVAL ECOSAC 10MM (ENDOMECHANICALS) ×1
SCISSORS LAP 5X35 DISP (ENDOMECHANICALS) ×2 IMPLANT
SET CHOLANGIOGRAPH 5 50 .035 (SET/KITS/TRAYS/PACK) ×2 IMPLANT
SET IRRIG TUBING LAPAROSCOPIC (IRRIGATION / IRRIGATOR) ×2 IMPLANT
SLEEVE ENDOPATH XCEL 5M (ENDOMECHANICALS) ×4 IMPLANT
SPECIMEN JAR SMALL (MISCELLANEOUS) ×2 IMPLANT
SPONGE GAUZE 2X2 STER 10/PKG (GAUZE/BANDAGES/DRESSINGS) ×1
STRIP CLOSURE SKIN 1/2X4 (GAUZE/BANDAGES/DRESSINGS) ×2 IMPLANT
SUT MNCRL AB 4-0 PS2 18 (SUTURE) ×2 IMPLANT
SUT VIC AB 3-0 SH 27 (SUTURE) ×1
SUT VIC AB 3-0 SH 27XBRD (SUTURE) ×1 IMPLANT
SUT VICRYL 0 UR6 27IN ABS (SUTURE) ×4 IMPLANT
TOWEL OR 17X24 6PK STRL BLUE (TOWEL DISPOSABLE) ×2 IMPLANT
TOWEL OR 17X26 10 PK STRL BLUE (TOWEL DISPOSABLE) ×2 IMPLANT
TRAY LAPAROSCOPIC MC (CUSTOM PROCEDURE TRAY) ×2 IMPLANT
TROCAR XCEL BLUNT TIP 100MML (ENDOMECHANICALS) ×2 IMPLANT
TROCAR XCEL NON-BLD 5MMX100MML (ENDOMECHANICALS) ×2 IMPLANT
TUBING INSUFFLATION (TUBING) ×2 IMPLANT

## 2015-04-24 NOTE — Anesthesia Preprocedure Evaluation (Addendum)
Anesthesia Evaluation  Patient identified by MRN, date of birth, ID band Patient awake    Reviewed: Allergy & Precautions, NPO status , Patient's Chart, lab work & pertinent test results  Airway Mallampati: II   Neck ROM: full    Dental  (+) Dental Advisory Given   Pulmonary Current Smoker,    breath sounds clear to auscultation       Cardiovascular hypertension, Pt. on medications  Rhythm:regular Rate:Normal     Neuro/Psych    GI/Hepatic   Endo/Other  diabetes, Type 2, Oral Hypoglycemic Agents  Renal/GU      Musculoskeletal   Abdominal   Peds  Hematology   Anesthesia Other Findings   Reproductive/Obstetrics                           Anesthesia Physical Anesthesia Plan  ASA: II  Anesthesia Plan: General   Post-op Pain Management:    Induction: Intravenous  Airway Management Planned: Oral ETT  Additional Equipment:   Intra-op Plan:   Post-operative Plan: Extubation in OR  Informed Consent: I have reviewed the patients History and Physical, chart, labs and discussed the procedure including the risks, benefits and alternatives for the proposed anesthesia with the patient or authorized representative who has indicated his/her understanding and acceptance.     Plan Discussed with: CRNA, Anesthesiologist and Surgeon  Anesthesia Plan Comments:         Anesthesia Quick Evaluation

## 2015-04-24 NOTE — Transfer of Care (Signed)
Immediate Anesthesia Transfer of Care Note  Patient: Taylor Gibbs  Procedure(s) Performed: Procedure(s): LAPAROSCOPIC CHOLECYSTECTOMY WITH INTRAOPERATIVE CHOLANGIOGRAM (N/A)  Patient Location: PACU  Anesthesia Type:General  Level of Consciousness: awake, alert , oriented and patient cooperative  Airway & Oxygen Therapy: Patient Spontanous Breathing and Patient connected to nasal cannula oxygen  Post-op Assessment: Report given to RN and Post -op Vital signs reviewed and stable  Post vital signs: Reviewed and stable  Last Vitals:  Filed Vitals:   04/24/15 0956 04/24/15 1410  BP: 134/75 113/67  Pulse: 78 87  Temp: 37.2 C 37.4 C  Resp: 16 16    Complications: No apparent anesthesia complications

## 2015-04-24 NOTE — H&P (Signed)
History and Physical  Patient Name: Taylor Gibbs     JXB:[id number]    DOB: Aug 21, 1965    DOA: 04/23/2015 Referring physician: Harolyn RutherfordShawn Joy, PA-C PCP: Ethel RanaHEPLER,MARK, PA-C      Chief Complaint: Abdominal pain  HPI: Taylor Gibbs is a 50 y.o. female with a past medical history significant for HTN and NIDDM who presents with abdominal pain.  The patient was in her usual state of health until last few days when she felt "not herself" and then the morning before admission woke up at 4 AM from sleep with severe RUQ abdominal pain radiating to the back accompanied by nausea and vomiting.  The pain was "bloating" and indigestion-like, associated with heartburn, and constant throughout the day. She had NBNB emesis several times during the day, and no appetite. The discomfort was not exertional, and not associated with shortness of breath, hematemesis, melena, fever, or chills.  In the ED, the patient was afebrile and slightly hypertensive.  Na 138, K 4.4, Cr 0.8, transaminases and lipase normal, WBC 13.1K, Hgb 14.  A CXR showed normal mediastinum and no airspace opacities and RUQ US showed 1.6 cm gallstone without evidence of cholecystitis.  The case was discussed with Dr. Donell BeersByerly, who recommended medical admission for IV antibiotics and possible lap chole.     Review of Systems:  Pt complains of right upper quadrant pain, nausea and vomiting, indigestion, heartburn, bloating, intermittent dizziness with looking to the left. Pt denies any fever, chills, rashes.  All other systems negative except as just noted or noted in the history of present illness.  No Known Allergies  Prior to Admission medications   Medication Sig Start Date End Date Taking? Authorizing Provider  aspirin EC 81 MG tablet Take 81 mg by mouth at bedtime.   Yes Historical Provider, MD  ibuprofen (ADVIL,MOTRIN) 200 MG tablet Take 400 mg by mouth every 6 (six) hours as needed.   Yes Historical Provider, MD    lisinopril-hydrochlorothiazide (PRINZIDE,ZESTORETIC) 20-12.5 MG tablet Take 1 tablet by mouth daily. 04/09/15  Yes Historical Provider, MD  metFORMIN (GLUCOPHAGE) 500 MG tablet Take 500 mg by mouth 2 (two) times daily. 04/09/15  Yes Historical Provider, MD  triamcinolone cream (KENALOG) 0.1 % Apply 1 application topically 2 (two) times daily. 02/21/15  Yes Historical Provider, MD  acetaminophen-codeine (TYLENOL #3) 300-30 MG tablet Take 1 tablet by mouth once.    Historical Provider, MD    Past Medical History  Diagnosis Date  . Diabetes mellitus without complication (HCC)   . Hypertension     Past Surgical History  Procedure Laterality Date  . Abdominal hysterectomy    . Ovarian cyst removal      Family history: family history includes Diabetes in her father and mother; Gallbladder disease in her father and mother; Heart attack in her father; Hypertension in her father and mother; Stroke in her mother.  Social History: Patient lives With her daughter. She is a Advice workerreal estate office manager. She smokes. She is independent with all ADLs and IADLs.       Physical Exam: BP 150/88 mmHg  Pulse 81  Temp(Src) 98 F (36.7 C) (Oral)  Resp 20  Ht 5\' 7"  (1.702 m)  Wt 74.844 kg (165 lb)  BMI 25.84 kg/m2  SpO2 98% General appearance: Well-developed, adult female, alert and in no acute distress.   Eyes: Anicteric, conjunctiva pink, lids and lashes normal.     ENT: No nasal deformity, discharge, or epistaxis.  OP moist without lesions.  Lymph: No cervical or supraclavicular lymphadenopathy. Skin: Warm and dry.  No jaundice.  Erythema over the eyelids, heliotrope. Cardiac: RRR, nl S1-S2, no murmurs appreciated.  Capillary refill is brisk.  JVP normal.  No LE edema.  Radial and DP pulses 2+ and symmetric. Respiratory: Normal respiratory rate and rhythm.  CTAB without rales or wheezes. Abdomen: Abdomen soft without rigidity.  RUQ TTP, without rebound or HSM. No ascites, distension.   MSK: No  deformities or effusions. Neuro: Sensorium intact and responding to questions, attention normal.  Speech is fluent.  Moves all extremities equally and with normal coordination.    Psych: Behavior appropriate.  Affect normal.  No evidence of aural or visual hallucinations or delusions.       Labs on Admission:  The metabolic panel shows normal electrolytes and renal function. Transaminases and bilirubin are normal. Urinalysis shows hyaline casts without pyuria or hematuria. Lipase normal. The complete blood count shows mild leukocytosis without anemia or thrombocytopenia.   Radiological Exams on Admission: Personally reviewed: Dg Chest 2 View 04/24/2015   IMPRESSION: Mild peribronchial thickening noted.  Lungs otherwise clear.    US Abdomen Limited Ruq 04/23/2015  CLINICAL DATA:  One day history of right upper quadrant pain with vomiting EXAM: US ABDOMEN LIMITED - RIGHT UPPER QUADRANT COMPARISON:  None. FINDINGS: Gallbladder: There is a calculus measuring 1.6 cm in length which is noted in the neck of the gallbladder and appears adherent. This calculus does shadow. There is no gallbladder wall thickening or pericholecystic fluid. No sonographic Murphy sign noted by sonographer. Common bile duct: Diameter: 3 mm. There is no intrahepatic or extrahepatic biliary duct dilatation. Liver: No focal lesion identified. Within normal limits in parenchymal echogenicity. IMPRESSION: Calculus adherent in the neck of the gallbladder. Study otherwise unremarkable. Electronically Signed   By: Bretta Bang III M.D.   On: 04/23/2015 21:36      Assessment/Plan 1. RUQ abdominal pain, suspect biliary colic:  This is new.  Presents with 1 day constant RUQ pain radiating to back associated with bloating, heartburn, and NBNB N/V.  RUQ US shows 1.6cm stone without evidence of gallbladder inflammation.  ECG and troponin normal.  LFTs normal. -NPO -MIVF -Ceftriaxone and metronidazole IV -Hydromorphone and  ondansetron for symptom control -Consult to General Surgery, appreciate cares  From the standpoint of cardiovascular clearance for surgery, the patient is low risk for this intermediate risk procedure.  RCRI 0.  No further testing indicated.   2. Type 2 diabetes:  -Check HgbA1c -Hold metformin while inpaitent -Sliding scale corrections, low dose while NPO  3. HTN:  Slightly hypertensive at admission. -Hold ACEi in case she will undergo surgery -Continue HCTZ  4. Leukocytosis:  Likely from gallstone      DVT PPx: SCDs Diet: NPO Consultants: General Surgery Code Status: FULL Family Communication: None present  Medical decision making: What exists of the patient's previous chart was reviewed in depth and the case was discussed with Harolyn Rutherford and Ross Marcus, MD. Patient seen 2:15 AM on 04/24/2015.  Disposition Plan:  I recommend admission to medical surgical bed, inpatient status.  Clinical condition: stable.  Anticipate MIVF and pain control.  Consultation with General Surgery, disposition pending surgical evaluation.      Alberteen Sam [facility] Pager [phone]

## 2015-04-24 NOTE — Consult Note (Signed)
Reason for Consult:gallstones Referring Physician: Caryl Asp, PA-C  Taylor Gibbs is an 50 y.o. female.  HPI:  Pt is a 50 yo F who presents with pain that awoke her from sleep in the RUQ around 24 hours ago.  The pain was severe and sudden and has not gone away since then.  It has improved and temporarily abated with pain medication, but has not resolved.  She denies fever/chills/v/diarrhea/constipation.  She has had some mild episodes of abdominal pain that have been present at night, but nothing that brought her to medical attention.  Both her parents have had gallbladder removal.  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    Past Medical History  Diagnosis Date  . Diabetes mellitus without complication ([place])   . Hypertension     Past Surgical History  Procedure Laterality Date  . Abdominal hysterectomy    . Ovarian cyst removal      Family History  Problem Relation Age of Onset  . Diabetes Mother   . Hypertension Mother   . Stroke Mother   . Gallbladder disease Mother   . Gallbladder disease Father   . Hypertension Father   . Diabetes Father   . Heart attack Father     Social History:  reports that she has been smoking Cigarettes.  She has been smoking about 0.50 packs per day. She does not have any smokeless tobacco history on file. She reports that she does not drink alcohol. Her drug history is not on file.  Allergies: No Known Allergies  Medications: aspirin EC 81 MG tablet    ibuprofen (ADVIL,MOTRIN) 200 MG tablet   lisinopril-hydrochlorothiazide (PRINZIDE,ZESTORETIC) 20-12.5 MG tablet   metFORMIN (GLUCOPHAGE) 500 MG tablet   triamcinolone cream (KENALOG) 0.1 %   acetaminophen-codeine (TYLENOL #3) 300-30 MG tablet        Results for orders placed or performed during the hospital encounter of 04/23/15 (from the past 48 hour(s))  Urinalysis, Routine w reflex microscopic (not at [facility])     Status: Abnormal   Collection Time: 04/23/15  4:32 PM  Result Value  Ref Range   Color, Urine YELLOW YELLOW   APPearance CLEAR CLEAR   Specific Gravity, Urine 1.026 1.005 - 1.030   pH 5.0 5.0 - 8.0   Glucose, UA NEGATIVE NEGATIVE mg/dL   Hgb urine dipstick SMALL (A) NEGATIVE   Bilirubin Urine NEGATIVE NEGATIVE   Ketones, ur 15 (A) NEGATIVE mg/dL   Protein, ur NEGATIVE NEGATIVE mg/dL   Nitrite NEGATIVE NEGATIVE   Leukocytes, UA NEGATIVE NEGATIVE  Urine microscopic-add on     Status: Abnormal   Collection Time: 04/23/15  4:32 PM  Result Value Ref Range   Squamous Epithelial / LPF 0-5 (A) NONE SEEN   WBC, UA 0-5 0 - 5 WBC/hpf   RBC / HPF 0-5 0 - 5 RBC/hpf   Bacteria, UA RARE (A) NONE SEEN   Casts HYALINE CASTS (A) NEGATIVE   Urine-Other MUCOUS PRESENT   Lipase, blood     Status: None   Collection Time: 04/23/15  5:03 PM  Result Value Ref Range   Lipase 20 11 - 51 U/L  Comprehensive metabolic panel     Status: Abnormal   Collection Time: 04/23/15  5:03 PM  Result Value Ref Range   Sodium 138 135 - 145 mmol/L   Potassium 4.4 3.5 - 5.1 mmol/L   Chloride 100 (L) 101 - 111 mmol/L   CO2 25 22 - 32 mmol/L   Glucose,  Bld 177 (H) 65 - 99 mg/dL   BUN 12 6 - 20 mg/dL   Creatinine, Ser 0.77 0.44 - 1.00 mg/dL   Calcium 9.8 8.9 - 10.3 mg/dL   Total Protein 7.1 6.5 - 8.1 g/dL   Albumin 4.3 3.5 - 5.0 g/dL   AST 17 15 - 41 U/L   ALT 18 14 - 54 U/L   Alkaline Phosphatase 84 38 - 126 U/L   Total Bilirubin 1.0 0.3 - 1.2 mg/dL   GFR calc non Af Amer >60 >60 mL/min   GFR calc Af Amer >60 >60 mL/min    Comment: (NOTE) The eGFR has been calculated using the CKD EPI equation. This calculation has not been validated in all clinical situations. eGFR's persistently <60 mL/min signify possible Chronic Kidney Disease.    Anion gap 13 5 - 15  CBC     Status: Abnormal   Collection Time: 04/23/15  5:03 PM  Result Value Ref Range   WBC 13.1 (H) 4.0 - 10.5 K/uL   RBC 4.89 3.87 - 5.11 MIL/uL   Hemoglobin 14.3 12.0 - 15.0 g/dL   HCT 41.9 36.0 - 46.0 %   MCV 85.7  78.0 - 100.0 fL   MCH 29.2 26.0 - 34.0 pg   MCHC 34.1 30.0 - 36.0 g/dL   RDW 12.5 11.5 - 15.5 %   Platelets 250 150 - 400 K/uL    Dg Chest 2 View  04/24/2015  CLINICAL DATA:  Preoperative chest radiograph. Shortness of breath. Initial encounter. EXAM: CHEST  2 VIEW COMPARISON:  None. FINDINGS: The lungs are well-aerated. Mild peribronchial thickening is noted. There is no evidence of focal opacification, pleural effusion or pneumothorax. The heart is normal in size; the mediastinal contour is within normal limits. No acute osseous abnormalities are seen. IMPRESSION: Mild peribronchial thickening noted.  Lungs otherwise clear. Electronically Signed   By: Garald Balding M.D.   On: 04/24/2015 03:21   US Abdomen Limited Ruq  04/23/2015  CLINICAL DATA:  One day history of right upper quadrant pain with vomiting EXAM: US ABDOMEN LIMITED - RIGHT UPPER QUADRANT COMPARISON:  None. FINDINGS: Gallbladder: There is a calculus measuring 1.6 cm in length which is noted in the neck of the gallbladder and appears adherent. This calculus does shadow. There is no gallbladder wall thickening or pericholecystic fluid. No sonographic Murphy sign noted by sonographer. Common bile duct: Diameter: 3 mm. There is no intrahepatic or extrahepatic biliary duct dilatation. Liver: No focal lesion identified. Within normal limits in parenchymal echogenicity. IMPRESSION: Calculus adherent in the neck of the gallbladder. Study otherwise unremarkable. Electronically Signed   By: Lowella Grip III M.D.   On: 04/23/2015 21:36    Review of Systems  Constitutional: Negative.   HENT: Negative.   Eyes: Negative.   Respiratory: Negative.   Cardiovascular: Negative.   Gastrointestinal: Positive for nausea and abdominal pain.  Genitourinary: Negative.   Musculoskeletal: Negative.   Skin: Negative.   Neurological: Negative.   Endo/Heme/Allergies: Negative.   Psychiatric/Behavioral: Negative.    Blood pressure 134/84, pulse 80,  temperature 98 F (36.7 C), temperature source Oral, resp. rate 18, height 5' 7"  (1.702 m), weight 74.844 kg (165 lb), SpO2 97 %. Physical Exam  Constitutional: She is oriented to person, place, and time. She appears well-developed and well-nourished. She appears distressed (looks uncomfortable).  HENT:  Head: Normocephalic and atraumatic.  Right Ear: External ear normal.  Left Ear: External ear normal.  Eyes: Conjunctivae are normal. Pupils are equal,  round, and reactive to light. Right eye exhibits no discharge. Left eye exhibits no discharge. No scleral icterus.  Neck: Normal range of motion. Neck supple. No tracheal deviation present. No thyromegaly present.  Cardiovascular: Normal rate, regular rhythm, normal heart sounds and intact distal pulses.   No murmur heard. Respiratory: Effort normal and breath sounds normal. No respiratory distress. She has no wheezes. She has no rales. She exhibits no tenderness.  GI: Soft. She exhibits no distension and no mass. There is tenderness. There is no rebound (RUQ) and no guarding.  Musculoskeletal: Normal range of motion. She exhibits no edema or tenderness.  Neurological: She is alert and oriented to person, place, and time. Coordination normal.  Skin: Skin is warm and dry. No rash noted. She is not diaphoretic. No erythema. No pallor.  Psychiatric: She has a normal mood and affect. Her behavior is normal. Judgment and thought content normal.    Assessment/Plan: Acute calculous cholecystitis NPO IVF IV Antibiotics Plan lap chole today barring unforeseen consequences. The surgical procedure was described to the patient in detail.  The patient was given educational material.  I discussed the incision type and location, the location of the gallbladder, the anatomy of the bile ducts and arteries, and the typical progression of surgery.  I discussed the possibility of converting to an open operation.  I advised of the risks of bleeding, infection,  damage to other structures (such as the bile duct, intestine or liver), bile leak, need for other procedures or surgeries, and post op diarrhea/constipation.  We discussed the risk of blood clot.  We discussed the recovery period and post operative restrictions.      Nabeeha Badertscher 04/24/2015, 6:49 AM

## 2015-04-24 NOTE — Discharge Instructions (Signed)

## 2015-04-24 NOTE — Progress Notes (Addendum)
Patient ID: Taylor Gibbs, female   DOB: 1965-09-19, 50 y.o.   MRN: [id number] [facility] PROGRESS NOTE  Taylor Gibbs:[id number] DOB: 1965/12/10 DOA: 04/23/2015 PCP: Ethel Rana  Brief narrative:    Pt admitted after midnight, please refer to admission note doen 4/11 for further details  50 y.o. female with past medical history significant for HTN and NIDDM who presented to [facility] ED with constant abdominal pain in right upper quadrant which woke her up at 4 am. Pain is associated with N/V. No associated fevers or chills.  In the ED, the patient was afebrile and slightly hypertensive.BLood work was notable for WBC count 13.1 otherwise unremarkable. Including LFT's.   Assessment/Plan:    Principal Problem:   Biliary colic / Right upper quadrant abdominal pain / Leukocytosis  - Abdominal US showed calculus adherent in the neck of the gallbladder.   - Surgery has seen the pt in consultation, plan for surgery today  - LFT's are WNL - Pt on cipro adn flagyl; will see with surgery if this is needed. There is mild leukocytosis but no fever and no evidence of cholecystitic on [place] so we may be able to stop the abx - Continue NPO for now - Continue supportive care with IV fluids   DVT Prophylaxis  - SCD's bilaterally    Code Status: Full.  Family Communication:  plan of care discussed with the patient Disposition Plan: home once she feels better, plan for surgery today   IV access:  Peripheral IV  Procedures and diagnostic studies:    Dg Chest 2 View 04/24/2015  Mild peribronchial thickening noted.  Lungs otherwise clear. Electronically Signed   By: Roanna Raider M.D.   On: 04/24/2015 03:21   US Abdomen Limited Ruq 04/23/2015 Calculus adherent in the neck of the gallbladder. Study otherwise unremarkable. Electronically Signed   By: Bretta Bang III M.D.   On: 04/23/2015 21:36   Medical Consultants:  Surgery   Other Consultants:  None  IAnti-Infectives:    Rocephin 04/24/2015 -->  Flagyl 04/24/2015 -->  Manson Passey, MD  [facility] Pager [phone]  Time spent in minutes: 25 minutes  If 7PM-7AM, please contact night-coverage [email or web address] Password [facility] 04/24/2015, 10:36 AM   LOS: 0 days    HPI/Subjective: No acute overnight events. Patient reports she feels better this am.   Objective: Filed Vitals:   04/24/15 0615 04/24/15 0645 04/24/15 0806 04/24/15 0956  BP: 134/84 133/81 140/83 134/75  Pulse: 80 77 72 78  Temp:   98.8 F (37.1 C) 98.9 F (37.2 C)  TempSrc:   Oral Oral  Resp: Height:      Weight:      SpO2: 97% 97% 97% 97%   No intake or output data in the 24 hours ending 04/24/15 1036   Exam:   General:  Pt is alert, follows commands appropriately, not in acute distress  Cardiovascular: Regular rate and rhythm, S1/S2 appreciated   Respiratory: Clear to auscultation bilaterally, no wheezing, no crackles, no rhonchi  Abdomen: Soft, non distended, bowel sounds present  Extremities: No edema, pulses palpable bilaterally  Neuro: Grossly nonfocal  Data Reviewed: Basic Metabolic Panel:  Recent Labs Lab 04/23/15 1703 04/24/15 0824  NA 138 139  K 4.4 3.6  CL 100* 107  CO2 25 22  GLUCOSE 177* 153*  BUN 12 8  CREATININE 0.77 0.68  CALCIUM 9.8 7.8*   Liver Function Tests:  Recent Labs Lab 04/23/15 1703 04/24/15  0824  AST 17 14*  ALT 18 13*  ALKPHOS 84 61  BILITOT 1.0 0.8  PROT 7.1 5.4*  ALBUMIN 4.3 3.0*    Recent Labs Lab 04/23/15 1703  LIPASE 20   No results for input(s): AMMONIA in the last 168 hours. CBC:  Recent Labs Lab 04/23/15 1703 04/24/15 0824  WBC 13.1* 14.8*  HGB 14.3 11.7*  HCT 41.9 34.8*  MCV 85.7 85.7  PLT 250 178   Cardiac Enzymes: No results for input(s): CKTOTAL, CKMB, CKMBINDEX, TROPONINI in the last 168 hours. BNP: Invalid input(s): POCBNP CBG: No results for input(s): GLUCAP in the last 168 hours.  No results found for this or any  previous visit (from the past 240 hour(s)).   Scheduled Meds: . [MAR Hold] aspirin EC  81 mg Oral QHS  . [MAR Hold] cefTRIAXone (ROCEPHIN)  IV  1 g Intravenous Q24H  . [MAR Hold] hydrochlorothiazide  12.5 mg Oral Daily  . [MAR Hold] insulin aspart  0-9 Units Subcutaneous 6 times per day  . [MAR Hold] metronidazole  500 mg Intravenous Q8H   Continuous Infusions: . sodium chloride    . sodium chloride 125 mL/hr at 04/24/15 0934  . lactated ringers    . lactated ringers

## 2015-04-24 NOTE — H&P (View-Only) (Signed)
Reason for Consult:gallstones Referring Physician: Caryl Asp, PA-C  Taylor Gibbs is an 50 y.o. female.  HPI:  Pt is a 50 yo F who presents with pain that awoke her from sleep in the RUQ around 24 hours ago.  The pain was severe and sudden and has not gone away since then.  It has improved and temporarily abated with pain medication, but has not resolved.  She denies fever/chills/v/diarrhea/constipation.  She has had some mild episodes of abdominal pain that have been present at night, but nothing that brought her to medical attention.  Both her parents have had gallbladder removal.  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    Past Medical History  Diagnosis Date  . Diabetes mellitus without complication ([place])   . Hypertension     Past Surgical History  Procedure Laterality Date  . Abdominal hysterectomy    . Ovarian cyst removal      Family History  Problem Relation Age of Onset  . Diabetes Mother   . Hypertension Mother   . Stroke Mother   . Gallbladder disease Mother   . Gallbladder disease Father   . Hypertension Father   . Diabetes Father   . Heart attack Father     Social History:  reports that she has been smoking Cigarettes.  She has been smoking about 0.50 packs per day. She does not have any smokeless tobacco history on file. She reports that she does not drink alcohol. Her drug history is not on file.  Allergies: No Known Allergies  Medications: aspirin EC 81 MG tablet    ibuprofen (ADVIL,MOTRIN) 200 MG tablet   lisinopril-hydrochlorothiazide (PRINZIDE,ZESTORETIC) 20-12.5 MG tablet   metFORMIN (GLUCOPHAGE) 500 MG tablet   triamcinolone cream (KENALOG) 0.1 %   acetaminophen-codeine (TYLENOL #3) 300-30 MG tablet        Results for orders placed or performed during the hospital encounter of 04/23/15 (from the past 48 hour(s))  Urinalysis, Routine w reflex microscopic (not at [facility])     Status: Abnormal   Collection Time: 04/23/15  4:32 PM  Result Value  Ref Range   Color, Urine YELLOW YELLOW   APPearance CLEAR CLEAR   Specific Gravity, Urine 1.026 1.005 - 1.030   pH 5.0 5.0 - 8.0   Glucose, UA NEGATIVE NEGATIVE mg/dL   Hgb urine dipstick SMALL (A) NEGATIVE   Bilirubin Urine NEGATIVE NEGATIVE   Ketones, ur 15 (A) NEGATIVE mg/dL   Protein, ur NEGATIVE NEGATIVE mg/dL   Nitrite NEGATIVE NEGATIVE   Leukocytes, UA NEGATIVE NEGATIVE  Urine microscopic-add on     Status: Abnormal   Collection Time: 04/23/15  4:32 PM  Result Value Ref Range   Squamous Epithelial / LPF 0-5 (A) NONE SEEN   WBC, UA 0-5 0 - 5 WBC/hpf   RBC / HPF 0-5 0 - 5 RBC/hpf   Bacteria, UA RARE (A) NONE SEEN   Casts HYALINE CASTS (A) NEGATIVE   Urine-Other MUCOUS PRESENT   Lipase, blood     Status: None   Collection Time: 04/23/15  5:03 PM  Result Value Ref Range   Lipase 20 11 - 51 U/L  Comprehensive metabolic panel     Status: Abnormal   Collection Time: 04/23/15  5:03 PM  Result Value Ref Range   Sodium 138 135 - 145 mmol/L   Potassium 4.4 3.5 - 5.1 mmol/L   Chloride 100 (L) 101 - 111 mmol/L   CO2 25 22 - 32 mmol/L   Glucose,  Bld 177 (H) 65 - 99 mg/dL   BUN 12 6 - 20 mg/dL   Creatinine, Ser 0.77 0.44 - 1.00 mg/dL   Calcium 9.8 8.9 - 10.3 mg/dL   Total Protein 7.1 6.5 - 8.1 g/dL   Albumin 4.3 3.5 - 5.0 g/dL   AST 17 15 - 41 U/L   ALT 18 14 - 54 U/L   Alkaline Phosphatase 84 38 - 126 U/L   Total Bilirubin 1.0 0.3 - 1.2 mg/dL   GFR calc non Af Amer >60 >60 mL/min   GFR calc Af Amer >60 >60 mL/min    Comment: (NOTE) The eGFR has been calculated using the CKD EPI equation. This calculation has not been validated in all clinical situations. eGFR's persistently <60 mL/min signify possible Chronic Kidney Disease.    Anion gap 13 5 - 15  CBC     Status: Abnormal   Collection Time: 04/23/15  5:03 PM  Result Value Ref Range   WBC 13.1 (H) 4.0 - 10.5 K/uL   RBC 4.89 3.87 - 5.11 MIL/uL   Hemoglobin 14.3 12.0 - 15.0 g/dL   HCT 41.9 36.0 - 46.0 %   MCV 85.7  78.0 - 100.0 fL   MCH 29.2 26.0 - 34.0 pg   MCHC 34.1 30.0 - 36.0 g/dL   RDW 12.5 11.5 - 15.5 %   Platelets 250 150 - 400 K/uL    Dg Chest 2 View  04/24/2015  CLINICAL DATA:  Preoperative chest radiograph. Shortness of breath. Initial encounter. EXAM: CHEST  2 VIEW COMPARISON:  None. FINDINGS: The lungs are well-aerated. Mild peribronchial thickening is noted. There is no evidence of focal opacification, pleural effusion or pneumothorax. The heart is normal in size; the mediastinal contour is within normal limits. No acute osseous abnormalities are seen. IMPRESSION: Mild peribronchial thickening noted.  Lungs otherwise clear. Electronically Signed   By: Garald Balding M.D.   On: 04/24/2015 03:21   US Abdomen Limited Ruq  04/23/2015  CLINICAL DATA:  One day history of right upper quadrant pain with vomiting EXAM: US ABDOMEN LIMITED - RIGHT UPPER QUADRANT COMPARISON:  None. FINDINGS: Gallbladder: There is a calculus measuring 1.6 cm in length which is noted in the neck of the gallbladder and appears adherent. This calculus does shadow. There is no gallbladder wall thickening or pericholecystic fluid. No sonographic Murphy sign noted by sonographer. Common bile duct: Diameter: 3 mm. There is no intrahepatic or extrahepatic biliary duct dilatation. Liver: No focal lesion identified. Within normal limits in parenchymal echogenicity. IMPRESSION: Calculus adherent in the neck of the gallbladder. Study otherwise unremarkable. Electronically Signed   By: Lowella Grip III M.D.   On: 04/23/2015 21:36    Review of Systems  Constitutional: Negative.   HENT: Negative.   Eyes: Negative.   Respiratory: Negative.   Cardiovascular: Negative.   Gastrointestinal: Positive for nausea and abdominal pain.  Genitourinary: Negative.   Musculoskeletal: Negative.   Skin: Negative.   Neurological: Negative.   Endo/Heme/Allergies: Negative.   Psychiatric/Behavioral: Negative.    Blood pressure 134/84, pulse 80,  temperature 98 F (36.7 C), temperature source Oral, resp. rate 18, height 5' 7"  (1.702 m), weight 74.844 kg (165 lb), SpO2 97 %. Physical Exam  Constitutional: She is oriented to person, place, and time. She appears well-developed and well-nourished. She appears distressed (looks uncomfortable).  HENT:  Head: Normocephalic and atraumatic.  Right Ear: External ear normal.  Left Ear: External ear normal.  Eyes: Conjunctivae are normal. Pupils are equal,  round, and reactive to light. Right eye exhibits no discharge. Left eye exhibits no discharge. No scleral icterus.  Neck: Normal range of motion. Neck supple. No tracheal deviation present. No thyromegaly present.  Cardiovascular: Normal rate, regular rhythm, normal heart sounds and intact distal pulses.   No murmur heard. Respiratory: Effort normal and breath sounds normal. No respiratory distress. She has no wheezes. She has no rales. She exhibits no tenderness.  GI: Soft. She exhibits no distension and no mass. There is tenderness. There is no rebound (RUQ) and no guarding.  Musculoskeletal: Normal range of motion. She exhibits no edema or tenderness.  Neurological: She is alert and oriented to person, place, and time. Coordination normal.  Skin: Skin is warm and dry. No rash noted. She is not diaphoretic. No erythema. No pallor.  Psychiatric: She has a normal mood and affect. Her behavior is normal. Judgment and thought content normal.    Assessment/Plan: Acute calculous cholecystitis NPO IVF IV Antibiotics Plan lap chole today barring unforeseen consequences. The surgical procedure was described to the patient in detail.  The patient was given educational material.  I discussed the incision type and location, the location of the gallbladder, the anatomy of the bile ducts and arteries, and the typical progression of surgery.  I discussed the possibility of converting to an open operation.  I advised of the risks of bleeding, infection,  damage to other structures (such as the bile duct, intestine or liver), bile leak, need for other procedures or surgeries, and post op diarrhea/constipation.  We discussed the risk of blood clot.  We discussed the recovery period and post operative restrictions.      Aviraj Kentner 04/24/2015, 6:49 AM

## 2015-04-24 NOTE — Interval H&P Note (Signed)
History and Physical Interval Note:  04/24/2015 11:37 AM  Chantale E Martina SinnerSaxton  has presented today for surgery, with the diagnosis of ACUTE CHOLECYSTITIS  The various methods of treatment have been discussed with the patient and family. After consideration of risks, benefits and other options for treatment, the patient has consented to  Procedure(s): LAPAROSCOPIC CHOLECYSTECTOMY WITH INTRAOPERATIVE CHOLANGIOGRAM (N/A) as a surgical intervention .  The patient's history has been reviewed, patient examined, no change in status, stable for surgery.  I have reviewed the patient's chart and labs.  Questions were answered to the patient's satisfaction.    Pt seen and examined Chart reviewed  I believe the patient's symptoms are consistent with gallbladder disease.  I discussed laparoscopic cholecystectomy with IOC in detail.  The patient was shown diagrams detailing the procedure.  We discussed the risks and benefits of a laparoscopic cholecystectomy including, but not limited to bleeding, infection, injury to surrounding structures such as the intestine or liver, bile leak, retained gallstones, need to convert to an open procedure, prolonged diarrhea, blood clots such as  DVT, common bile duct injury, anesthesia risks, and possible need for additional procedures.  We discussed the typical post-operative recovery course. I explained that the likelihood of improvement of their symptoms is good.  Mary SellaEric M. Andrey CampanileWilson, MD, FACS General, Bariatric, & Minimally Invasive Surgery [facility], [place]  [facility] Center IncWILSON,Armandina Iman M

## 2015-04-24 NOTE — Op Note (Signed)
Taylor Gibbs [id number] 1965-07-23 04/24/2015  Laparoscopic Cholecystectomy with IOC Procedure Note  Indications: This patient presents with symptomatic gallbladder disease and will undergo laparoscopic cholecystectomy.  Pre-operative Diagnosis: symptomatic cholelithiasis  Post-operative Diagnosis: Calculus of gallbladder with acute cholecystitis, without mention of obstruction  Surgeon: Atilano Ina   Assistants: none  Anesthesia: General endotracheal anesthesia  Procedure Details  The patient was seen again in the Holding Room. The risks, benefits, complications, treatment options, and expected outcomes were discussed with the patient. The possibilities of reaction to medication, pulmonary aspiration, perforation of viscus, bleeding, recurrent infection, finding a normal gallbladder, the need for additional procedures, failure to diagnose a condition, the possible need to convert to an open procedure, and creating a complication requiring transfusion or operation were discussed with the patient. The likelihood of improving the patient's symptoms with return to their baseline status is good.  The patient and/or family concurred with the proposed plan, giving informed consent. The site of surgery properly noted. The patient was taken to Operating Room, identified as [name] and the procedure verified as Laparoscopic Cholecystectomy with Intraoperative Cholangiogram. A Time Out was held and the above information confirmed. Antibiotic prophylaxis was administered.   Prior to the induction of general anesthesia, antibiotic prophylaxis was administered. General endotracheal anesthesia was then administered and tolerated well. After the induction, the abdomen was prepped with Chloraprep and draped in the sterile fashion. The patient was positioned in the supine position.  Local anesthetic agent was injected into the skin near the umbilicus and an incision made. We dissected down to the  abdominal fascia with blunt dissection.  The fascia was incised vertically and we entered the peritoneal cavity bluntly.  A pursestring suture of 0-Vicryl was placed around the fascial opening.  The Hasson cannula was inserted and secured with the stay suture.  Pneumoperitoneum was then created with CO2 and tolerated well without any adverse changes in the patient's vital signs. An 5-mm port was placed in the subxiphoid position.  Two 5-mm ports were placed in the right upper quadrant. All skin incisions were infiltrated with a local anesthetic agent before making the incision and placing the trocars.   We positioned the patient in reverse Trendelenburg, tilted slightly to the patient's left. The gallbladder was very distended and thick walled. It had to be aspirated in order to grasp and retract it. Omentum was peeled off of the gallbladder.  The gallbladder was identified, the fundus grasped and retracted cephalad. The gallbladder had a pretty thick inflammatory rind around infundibulum and fair amount of edema. Tedious dissection was commenced to properly identify critical structures. Adhesions were lysed bluntly and with the electrocautery where indicated, taking care not to injure any adjacent organs or viscus. The gallbladder was quite long and large. The infundibulum was grasped and retracted laterally, exposing the peritoneum overlying the triangle of Calot. This was then divided and exposed in a blunt fashion. The cystic artery and node were easily identified.  A critical view of the cystic duct and cystic artery was obtained.  The cystic duct was clearly identified and bluntly dissected circumferentially. The cystic duct was ligated with a clip distally.  A clip was also placed across the cystic artery.  An incision was made in the cystic duct and the [facility] cholangiogram catheter introduced. The catheter was secured using a clip. A cholangiogram was then obtained which showed good visualization of the  distal and proximal biliary tree with no sign of filling defects or obstruction.  Contrast  flowed easily into the duodenum. The catheter was then removed.   The cystic duct was then ligated with 3 clips and divided. The cystic artery which had been identified & dissected free was completely ligated with clips and divided as well.   The gallbladder was dissected from the liver bed in retrograde fashion with the electrocautery. The gallbladder was removed and placed in an Ecco sac.  The gallbladder and Ecco sac were then removed through the umbilical port site. The liver bed was irrigated and inspected. Hemostasis was achieved with the electrocautery. Copious irrigation was utilized and was repeatedly aspirated until clear.  The pursestring suture was used to close the umbilical fascia.  There was an air leak so 2 additional interrupted 0 vicryl sutures were placed using the endoclose device with laparoscopic assistance.   We again inspected the right upper quadrant for hemostasis.  The umbilical closure was inspected and there was no air leak and nothing trapped within the closure. Pneumoperitoneum was released as we removed the trocars. I used a 3-0 vicryl to reattach the base of the umbilical stalk to the fascia.  4-0 Monocryl was used to close the skin.   Benzoin, steri-strips, and clean dressings were applied. The patient was then extubated and brought to the recovery room in stable condition. Instrument, sponge, and needle counts were correct at closure and at the conclusion of the case.   Findings: Cholecystitis with Cholelithiasis  Estimated Blood Loss: Minimal         Drains: none         Specimens: Gallbladder           Complications: None; patient tolerated the procedure well.         Disposition: PACU - hemodynamically stable.         Condition: stable  Mary SellaEric M. Andrey CampanileWilson, MD, FACS General, Bariatric, & Minimally Invasive Surgery [facility], [place]

## 2015-04-24 NOTE — ED Notes (Signed)
Attempted report x1. 

## 2015-04-24 NOTE — Anesthesia Procedure Notes (Addendum)
Procedure Name: Intubation Date/Time: 04/24/2015 12:04 PM Performed by: Faustino CongressWHITE, Esvin Hnat TENA Delmer Kowalski Pre-anesthesia Checklist: Patient identified, Emergency Drugs available, Suction available and Patient being monitored Patient Re-evaluated:Patient Re-evaluated prior to inductionOxygen Delivery Method: Circle System Utilized Preoxygenation: Pre-oxygenation with 100% oxygen Intubation Type: IV induction Ventilation: Mask ventilation without difficulty Laryngoscope Size: Mac and 3 Grade View: Grade II Tube type: Oral Tube size: 7.0 mm Number of attempts: 2 Airway Equipment and Method: Stylet Placement Confirmation: ETT inserted through vocal cords under direct vision,  positive ETCO2 and breath sounds checked- equal and bilateral Secured at: 22 cm Tube secured with: Tape Dental Injury: Teeth and Oropharynx as per pre-operative assessment  Comments: DL x1 by CRNA grade IV view, unsuccessful attempt with bougie, DL x2 by MD, grade II view, atraumatic insertion of oral ETT, BBS= and +ETCO2, tube secured.

## 2015-04-25 ENCOUNTER — Encounter (HOSPITAL_COMMUNITY): Payer: Self-pay | Admitting: General Surgery

## 2015-04-25 DIAGNOSIS — K8050 Calculus of bile duct without cholangitis or cholecystitis without obstruction: Secondary | ICD-10-CM | POA: Diagnosis not present

## 2015-04-25 DIAGNOSIS — I10 Essential (primary) hypertension: Secondary | ICD-10-CM

## 2015-04-25 DIAGNOSIS — R1011 Right upper quadrant pain: Secondary | ICD-10-CM | POA: Diagnosis not present

## 2015-04-25 DIAGNOSIS — E876 Hypokalemia: Secondary | ICD-10-CM | POA: Diagnosis not present

## 2015-04-25 DIAGNOSIS — D72829 Elevated white blood cell count, unspecified: Secondary | ICD-10-CM | POA: Diagnosis not present

## 2015-04-25 LAB — CBC
HCT: 32.1 % — ABNORMAL LOW (ref 36.0–46.0)
Hemoglobin: 11.2 g/dL — ABNORMAL LOW (ref 12.0–15.0)
MCH: 30.4 pg (ref 26.0–34.0)
MCHC: 34.9 g/dL (ref 30.0–36.0)
MCV: 87 fL (ref 78.0–100.0)
PLATELETS: 170 10*3/uL (ref 150–400)
RBC: 3.69 MIL/uL — ABNORMAL LOW (ref 3.87–5.11)
RDW: 13.2 % (ref 11.5–15.5)
WBC: 10.2 10*3/uL (ref 4.0–10.5)

## 2015-04-25 LAB — BASIC METABOLIC PANEL
Anion gap: 9 (ref 5–15)
BUN: 7 mg/dL (ref 6–20)
CALCIUM: 8.5 mg/dL — AB (ref 8.9–10.3)
CO2: 26 mmol/L (ref 22–32)
CREATININE: 0.77 mg/dL (ref 0.44–1.00)
Chloride: 105 mmol/L (ref 101–111)
GFR calc Af Amer: >60 mL/min (ref >60)
Glucose, Bld: 99 mg/dL (ref 65–99)
Potassium: 3.4 mmol/L — ABNORMAL LOW (ref 3.5–5.1)
SODIUM: 140 mmol/L (ref 135–145)

## 2015-04-25 LAB — GLUCOSE, CAPILLARY
GLUCOSE-CAPILLARY: 99 mg/dL (ref 65–99)
GLUCOSE-CAPILLARY: 99 mg/dL (ref 65–99)
Glucose-Capillary: 95 mg/dL (ref 65–99)
Glucose-Capillary: 130 mg/dL — ABNORMAL HIGH (ref 65–99)

## 2015-04-25 LAB — HEMOGLOBIN A1C
HEMOGLOBIN A1C: 6.7 % — AB (ref 4.8–5.6)
Mean Plasma Glucose: 146 mg/dL

## 2015-04-25 MED ORDER — POTASSIUM CHLORIDE 10 MEQ/100ML IV SOLN
10.0000 meq | INTRAVENOUS | Status: AC
  Administered 2015-04-25 (×3): 10 meq via INTRAVENOUS
  Filled 2015-04-25 (×3): qty 100

## 2015-04-25 NOTE — Progress Notes (Signed)
1 Day Post-Op  Subjective: (delayed note entry - pt seen this am, Dr Elisabeth Pigeonevine present) Reports fair amount of discomfort this am. But tolerating clears Ambulated in room  Objective: Vital signs in last 24 hours: Temp:  [98.3 F (36.8 C)-99.5 F (37.5 C)] 99.5 F (37.5 C) (04/12 2142) Pulse Rate:  [67-82] 82 (04/12 2142) Resp:  [16-20] 18 (04/12 2142) BP: (87-105)/(47-63) 105/53 mmHg (04/12 2142) SpO2:  [95 %-99 %] 95 % (04/12 2142) Last BM Date: 04/23/15  Intake/Output from previous day: 04/11 0701 - 04/12 0700 In: 1500 [I.V.:1500] Out: 10 [Blood:10] Intake/Output this shift:    Alert, nontoxic Reg Soft, obese, bruising at umbilicus. approp TTP  Lab Results:   Recent Labs  04/24/15 0824 04/25/15 0621  WBC 14.8* 10.2  HGB 11.7* 11.2*  HCT 34.8* 32.1*  PLT 178 170   BMET  Recent Labs  04/24/15 0824 04/25/15 0621  NA 139 140  K 3.6 3.4*  CL 107 105  CO2 22 26  GLUCOSE 153* 99  BUN 8 7  CREATININE 0.68 0.77  CALCIUM 7.8* 8.5*   PT/INR  Recent Labs  04/24/15 0824  LABPROT 14.0  INR 1.06   ABG No results for input(s): PHART, HCO3 in the last 72 hours.  Invalid input(s): PCO2, PO2  Studies/Results: Dg Chest 2 View  04/24/2015  CLINICAL DATA:  Preoperative chest radiograph. Shortness of breath. Initial encounter. EXAM: CHEST  2 VIEW COMPARISON:  None. FINDINGS: The lungs are well-aerated. Mild peribronchial thickening is noted. There is no evidence of focal opacification, pleural effusion or pneumothorax. The heart is normal in size; the mediastinal contour is within normal limits. No acute osseous abnormalities are seen. IMPRESSION: Mild peribronchial thickening noted.  Lungs otherwise clear. Electronically Signed   By: Roanna RaiderJeffery  Chang M.D.   On: 04/24/2015 03:21   Dg Cholangiogram Operative  04/25/2015  CLINICAL DATA:  50 year old female with a history of cholelithiasis EXAM: INTRAOPERATIVE CHOLANGIOGRAM TECHNIQUE: Cholangiographic images from the C-arm  fluoroscopic device were submitted for interpretation post-operatively. Please see the procedural report for the amount of contrast and the fluoroscopy time utilized. COMPARISON:  Ultrasound 04/23/2015 FINDINGS: Surgical instruments project over the upper abdomen. There is cannulation of the cystic duct/gallbladder neck, with antegrade infusion of contrast. Caliber of the extrahepatic ductal system within normal limits. Low insertion of the cystic duct. No large filling defect identified. Free flow of contrast across the ampulla. IMPRESSION: Intraoperative cholangiogram demonstrates extrahepatic biliary ducts of unremarkable caliber, with no large filling defect identified. Free flow of contrast across the ampulla. Please refer to the dictated operative report for full details of intraoperative findings and procedure Signed, Yvone NeuJaime S. Loreta AveWagner, DO [facility] Electronically Signed   By: Gilmer MorJaime  Wagner D.O.   On: 04/25/2015 10:38    Anti-infectives: Anti-infectives    Start     Dose/Rate Route Frequency Ordered Stop   04/24/15 2315  metroNIDAZOLE (FLAGYL) IVPB 500 mg     500 mg 100 mL/hr over 60 Minutes Intravenous Every 8 hours 04/24/15 1516 04/24/15 2309   04/24/15 1700  cefTRIAXone (ROCEPHIN) 1 g in dextrose 5 % 50 mL IVPB     1 g 100 mL/hr over 30 Minutes Intravenous  Once 04/24/15 1516 04/24/15 1744   04/24/15 0800  cefTRIAXone (ROCEPHIN) 1 g in dextrose 5 % 50 mL IVPB  Status:  Discontinued     1 g 100 mL/hr over 30 Minutes Intravenous Every 24 hours 04/24/15 0730 04/24/15 1516  04/24/15 0715  metroNIDAZOLE (FLAGYL) IVPB 500 mg  Status:  Discontinued     500 mg 100 mL/hr over 60 Minutes Intravenous Every 8 hours 04/24/15 0712 04/24/15 1516      Assessment/Plan: s/p Procedure(s): LAPAROSCOPIC CHOLECYSTECTOMY WITH INTRAOPERATIVE CHOLANGIOGRAM (N/A)  If she tolerates breakfast and lunch and her pain gets better than can go home this  pm Discussed dc instructions extensively with pt  Mary Sella. Andrey Campanile, MD, FACS General, Bariatric, & Minimally Invasive Surgery [facility], [place]   LOS: 1 day    Taylor Gibbs 04/25/2015

## 2015-04-25 NOTE — Progress Notes (Signed)
pts father is admitted on [facility], requesting to go see him. rn asked charge nurse if pt was allowed to leave the unit. Pt not allowed to independently leave unit unless order from md, but rn could personally escort pt down to [facility].  rn took pt down to [facility] to see her father for about 10 minutes.

## 2015-04-25 NOTE — Progress Notes (Addendum)
Patient ID: Taylor LeveeBeth E Ullmer, female   DOB: 13-Nov-1965, 50 y.o.   MRN: [id number] [facility] PROGRESS NOTE  Taylor Gibbs [id number] DOB: 13-Nov-1965 DOA: 04/23/2015 PCP: Ethel RanaHEPLER,MARK, PA-C  Brief narrative:    50 y.o. female with past medical history significant for HTN and NIDDM who [facility] with constant abdominal pain in right upper quadrant which woke her up at 4 am. Pain is associated with N/V. No associated fevers or chills.  In the ED, the patient was afebrile and slightly hypertensive.Blood work was notable for WBC count 13.1 otherwise unremarkable. Including LFT's.  She underwent lap cholecystectomy. No subsequent complications.    Assessment/Plan:    Principal Problem:   Biliary colic / Right upper quadrant abdominal pain / Leukocytosis  - Abdominal US showed calculus adherent in the neck of the gallbladder.   - LFT's are WNL - Surgery has seen the pt in consultation - Pt underwent lap cholecystectomy 04/24/2015 - Continue pain management efforts - Pt prefers to be on CLD, she has a poor appetite - Continue IV fluids for hydration - Stooped cipro and flagyl 04/24/2015   Hypokalemia - Due to GI losses, Hctz - Supplemented   Essential hypertension - Continue Hctz  DVT Prophylaxis  - SCD's bilaterally in hospital    Code Status: Full.  Family Communication:  plan of care discussed with the patient Disposition Plan: home 4/13  IV access:  Peripheral IV  Procedures and diagnostic studies:    Dg Chest 2 View 04/24/2015  Mild peribronchial thickening noted.  Lungs otherwise clear. Electronically Signed   By: Roanna RaiderJeffery  Chang M.D.   On: 04/24/2015 03:21   [place] Abdomen Limited Ruq 04/23/2015 Calculus adherent in the neck of the gallbladder. Study otherwise unremarkable. Electronically Signed   By: Bretta BangWilliam  Woodruff III M.D.   On: 04/23/2015 21:36   Medical Consultants:  Surgery   Other Consultants:  None  IAnti-Infectives:   Rocephin 04/24/2015 -->  04/24/2015 Flagyl 04/24/2015 --> 04/24/2015  Manson PasseyEVINE, Macarthur Lorusso, MD  [facility] Pager [phone]  Time spent in minutes: 25 minutes  If 7PM-7AM, please contact night-coverage [email or web address] Password [email or web address] 04/25/2015, 1:35 PM   LOS: 1 day    HPI/Subjective: No acute overnight events. Patient reports pain is 10/10 this am.  Objective: Filed Vitals:   04/25/15 0614 04/25/15 0917 04/25/15 1010 04/25/15 1109  BP: 92/61 89/59 88/61  95/59  Pulse: 75 70 73 67  Temp: 98.8 F (37.1 C) 98.3 F (36.8 C)    TempSrc: Oral Oral    Resp: 18 20 16 17   Height:      Weight:      SpO2: 96% 98% 96% 97%    Intake/Output Summary (Last 24 hours) at 04/25/15 1335 Last data filed at 04/24/15 1500  Gross per 24 hour  Intake    500 ml  Output     10 ml  Net    490 ml     Exam:   General:  Pt is alert, not in acute distress  Cardiovascular: RRR, S1/S2 (+)  Respiratory: No wheezing, no crackles, no rhonchi  Abdomen: (+) BS, tender at the surgery site  Extremities: No swelling, pulses palpable   Neuro: Nonfocal  Data Reviewed: Basic Metabolic Panel:  Recent Labs Lab 04/23/15 1703 04/24/15 0824 04/25/15 0621  NA 138 139 140  K 4.4 3.6 3.4*  CL 100* 107 105  CO2 25 22 26   GLUCOSE 177* 153* 99  BUN 12 8 7   CREATININE 0.77 0.68 0.77  CALCIUM 9.8 7.8* 8.5*   Liver Function Tests:  Recent Labs Lab 04/23/15 1703 04/24/15 0824  AST 17 14*  ALT 18 13*  ALKPHOS 84 61  BILITOT 1.0 0.8  PROT 7.1 5.4*  ALBUMIN 4.3 3.0*    Recent Labs Lab 04/23/15 1703  LIPASE 20   No results for input(s): AMMONIA in the last 168 hours. CBC:  Recent Labs Lab 04/23/15 1703 04/24/15 0824 04/25/15 0621  WBC 13.1* 14.8* 10.2  HGB 14.3 11.7* 11.2*  HCT 41.9 34.8* 32.1*  MCV 85.7 85.7 87.0  PLT 250 178 170   Cardiac Enzymes: No results for input(s): CKTOTAL, CKMB, CKMBINDEX, TROPONINI in the last 168 hours. BNP: Invalid input(s): POCBNP CBG:  Recent Labs Lab 04/24/15 1414  04/24/15 1621 04/24/15 2234 04/25/15 0612 04/25/15 1153  GLUCAP 148* 152* 129* 99 95    No results found for this or any previous visit (from the past 240 hour(s)).   Scheduled Meds: . enoxaparin (LOVENOX) injection  40 mg Subcutaneous Q24H  . hydrochlorothiazide  12.5 mg Oral Daily  . insulin aspart  0-15 Units Subcutaneous TID WC  . pantoprazole (PROTONIX) IV  40 mg Intravenous QHS   Continuous Infusions: . sodium chloride 125 mL/hr at 04/25/15 1005

## 2015-04-25 NOTE — Care Management Note (Signed)
Case Management Note  Patient Details  Name: Taylor Gibbs MRN: [id number] Date of Birth: 1965-02-22  Subjective/Objective:  Patient admitted with biliary colic. She is s/p lap chole day 1. She is from home with family.                Action/Plan: Plan is for discharge home when medically ready. CM following for d/c needs.   Expected Discharge Date:                  Expected Discharge Plan:     In-House Referral:     Discharge planning Services     Post Acute Care Choice:    Choice offered to:     DME Arranged:    DME Agency:     HH Arranged:    HH Agency:     Status of Service:  In process, will continue to follow  Medicare Important Message Given:    Date Medicare IM Given:    Medicare IM give by:    Date Additional Medicare IM Given:    Additional Medicare Important Message give by:     If discussed at Long Length of Stay Meetings, dates discussed:    Additional Comments:  [name], RN 04/25/2015, 11:17 AM

## 2015-04-25 NOTE — Anesthesia Postprocedure Evaluation (Signed)
Anesthesia Post Note  Patient: Taylor Gibbs  Procedure(s) Performed: Procedure(s) (LRB): LAPAROSCOPIC CHOLECYSTECTOMY WITH INTRAOPERATIVE CHOLANGIOGRAM (N/A)  Patient location during evaluation: PACU Anesthesia Type: General Level of consciousness: awake and alert Pain management: pain level controlled Vital Signs Assessment: post-procedure vital signs reviewed and stable Respiratory status: spontaneous breathing, nonlabored ventilation, respiratory function stable and patient connected to nasal cannula oxygen Cardiovascular status: blood pressure returned to baseline and stable Postop Assessment: no signs of nausea or vomiting Anesthetic complications: no    Last Vitals:  Filed Vitals:   04/25/15 0157 04/25/15 0614  BP: 87/47 92/61  Pulse: 69 75  Temp: 36.9 C 37.1 C  Resp: 16 18    Last Pain:  Filed Vitals:   04/25/15 0615  PainSc: Asleep                 Phillips Groutarignan, Jazz Biddy

## 2015-04-25 NOTE — Progress Notes (Signed)
Pt reports abd pain increased around 0400, at present 10/10. Will give IV dilaudid for pain.

## 2015-04-26 DIAGNOSIS — D72829 Elevated white blood cell count, unspecified: Secondary | ICD-10-CM | POA: Diagnosis not present

## 2015-04-26 DIAGNOSIS — I10 Essential (primary) hypertension: Secondary | ICD-10-CM | POA: Diagnosis not present

## 2015-04-26 DIAGNOSIS — E876 Hypokalemia: Secondary | ICD-10-CM

## 2015-04-26 DIAGNOSIS — K8050 Calculus of bile duct without cholangitis or cholecystitis without obstruction: Secondary | ICD-10-CM | POA: Diagnosis not present

## 2015-04-26 LAB — BASIC METABOLIC PANEL
Anion gap: 8 (ref 5–15)
BUN: 6 mg/dL (ref 6–20)
CO2: 24 mmol/L (ref 22–32)
CREATININE: 0.75 mg/dL (ref 0.44–1.00)
Calcium: 8.2 mg/dL — ABNORMAL LOW (ref 8.9–10.3)
Chloride: 108 mmol/L (ref 101–111)
GFR calc Af Amer: >60 mL/min (ref >60)
GLUCOSE: 125 mg/dL — AB (ref 65–99)
POTASSIUM: 3.8 mmol/L (ref 3.5–5.1)
Sodium: 140 mmol/L (ref 135–145)

## 2015-04-26 LAB — GLUCOSE, CAPILLARY
GLUCOSE-CAPILLARY: 125 mg/dL — AB (ref 65–99)
Glucose-Capillary: 111 mg/dL — ABNORMAL HIGH (ref 65–99)
Glucose-Capillary: 122 mg/dL — ABNORMAL HIGH (ref 65–99)

## 2015-04-26 MED ORDER — OXYCODONE HCL 5 MG PO TABS: 5.0000 mg | ORAL_TABLET | ORAL | Status: AC | PRN

## 2015-04-26 NOTE — Progress Notes (Signed)
Patient being discharged home, discharge summary reviewed with medications and activity restrictions, patient is alert and oriented with controled pain will transport down with wheel chair.

## 2015-04-26 NOTE — Discharge Summary (Signed)
Physician Discharge Summary  Taylor Gibbs:[id number] DOB: 1965/06/23 DOA: 04/23/2015  PCP: Ethel Rana  Admit date: 04/23/2015 Discharge date: 04/26/2015  Recommendations for Outpatient Follow-up:  1. Follow-up with surgery per scheduled appointment  Discharge Diagnoses:  Principal Problem:   Biliary colic Active Problems:   Essential hypertension   Diabetes mellitus without complication (HCC)   Leukocytosis    Discharge Condition: stable   Diet recommendation: as tolerated   History of present illness:  50 y.o. female with past medical history significant for HTN and NIDDM who presented to [facility] ED with constant abdominal pain in right upper quadrant which woke her up at 4 am. Pain is associated with N/V. No associated fevers or chills.  In the ED, the patient was afebrile and slightly hypertensive.Blood work was notable for WBC count 13.1 otherwise unremarkable. Including LFT's.  She underwent lap cholecystectomy. No subsequent complications.   Hospital Course:    Assessment/Plan:    Principal Problem:  Biliary colic / Right upper quadrant abdominal pain / Leukocytosis  - Abdominal US on admission showed calculus adherent in the neck of the gallbladder.  - LFT's were WNL - Pt is s/p lap cholecystectomy 04/24/2015 - Continue pain management efforts - Stooped cipro and flagyl 04/24/2015   Hypokalemia - Due to GI losses, Hctz - Supplemented   Essential hypertension - Continue home meds  DVT Prophylaxis  - SCD's bilaterally    Code Status: Full.  Family Communication: plan of care discussed with the patient   IV access:  Peripheral IV  Procedures and diagnostic studies:   Dg Chest 2 View 04/24/2015 Mild peribronchial thickening noted. Lungs otherwise clear. Electronically Signed By: Roanna Raider M.D. On: 04/24/2015 03:21   US Abdomen Limited Ruq 04/23/2015 Calculus adherent in the neck of the gallbladder. Study otherwise  unremarkable. Electronically Signed By: Bretta Bang III M.D. On: 04/23/2015 21:36   Medical Consultants:  Surgery   Other Consultants:  None  IAnti-Infectives:   Rocephin 04/24/2015 --> 04/24/2015 Flagyl 04/24/2015 --> 04/24/2015   Signed:  Manson Passey, MD  [facility] 04/26/2015, 9:14 AM  Pager #: [phone]  Time spent in minutes: more than 30 minutes   Discharge Exam: Filed Vitals:   04/26/15 0506 04/26/15 0904  BP: 113/62 109/64  Pulse: 74 70  Temp: 98.8 F (37.1 C) 99.3 F (37.4 C)  Resp: 18 18   Filed Vitals:   04/25/15 2142 04/26/15 0148 04/26/15 0506 04/26/15 0904  BP: 105/53 98/54 113/62 109/64  Pulse: 82 75 74 70  Temp: 99.5 F (37.5 C) 99.1 F (37.3 C) 98.8 F (37.1 C) 99.3 F (37.4 C)  TempSrc: Oral Oral Oral Oral  Resp: Height:      Weight:      SpO2: 95% 93% 97% 99%    General: Pt is alert, follows commands appropriately, not in acute distress Cardiovascular: Regular rate and rhythm, S1/S2 +, no murmurs Respiratory: Clear to auscultation bilaterally, no wheezing, no crackles, no rhonchi Abdominal: Soft, tender in mid abdomen and at surgery site, no guarding  Extremities: no edema, no cyanosis, pulses palpable bilaterally DP and PT Neuro: Grossly nonfocal  Discharge Instructions  Discharge Instructions    Call MD for:  difficulty breathing, headache or visual disturbances    Complete by:  As directed      Call MD for:  persistant dizziness or light-headedness    Complete by:  As directed      Call MD for:  persistant nausea and  vomiting    Complete by:  As directed      Call MD for:  severe uncontrolled pain    Complete by:  As directed      Diet - low sodium heart healthy    Complete by:  As directed      Increase activity slowly    Complete by:  As directed             Medication List    STOP taking these medications        ibuprofen 200 MG tablet  Commonly known as:  ADVIL,MOTRIN       TAKE these medications        acetaminophen-codeine 300-30 MG tablet  Commonly known as:  TYLENOL #3  Take 1 tablet by mouth once.     aspirin EC 81 MG tablet  Take 81 mg by mouth at bedtime.     lisinopril-hydrochlorothiazide 20-12.5 MG tablet  Commonly known as:  PRINZIDE,ZESTORETIC  Take 1 tablet by mouth daily.     metFORMIN 500 MG tablet  Commonly known as:  GLUCOPHAGE  Take 500 mg by mouth 2 (two) times daily.     oxyCODONE 5 MG immediate release tablet  Commonly known as:  Oxy IR/ROXICODONE  Take 1-2 tablets (5-10 mg total) by mouth every 4 (four) hours as needed for moderate pain.     triamcinolone cream 0.1 %  Commonly known as:  KENALOG  Apply 1 application topically 2 (two) times daily.           Follow-up Information    Follow up with [facility] On 05/16/2015.   Specialty:  General Surgery   Why:  arrive by 9:45AM for a 10:15AM post op check   Contact information:   [address] [place] [address] [phone]       Follow up with HEPLER,MARK, PA-C.   Specialty:  Physician Assistant   Why:  As needed   Contact information:   [address] [facility] [address] [place] [address] [phone]       Follow up with [facility], PA-C. Schedule an appointment as soon as possible for a visit in 1 week.   Specialty:  Physician Assistant   Why:  Follow up appt after recent hospitalization   Contact information:   [address] [facility] [address] [place] [address] [phone]        The results of significant diagnostics from this hospitalization (including imaging, microbiology, ancillary and laboratory) are listed below for reference.    Significant Diagnostic Studies: Dg Chest 2 View  04/24/2015  CLINICAL DATA:  Preoperative chest radiograph. Shortness of breath. Initial encounter. EXAM: CHEST  2 VIEW COMPARISON:  None. FINDINGS: The lungs are well-aerated. Mild peribronchial thickening is noted. There is no evidence of focal  opacification, pleural effusion or pneumothorax. The heart is normal in size; the mediastinal contour is within normal limits. No acute osseous abnormalities are seen. IMPRESSION: Mild peribronchial thickening noted.  Lungs otherwise clear. Electronically Signed   By: Roanna Raider M.D.   On: 04/24/2015 03:21   Dg Cholangiogram Operative  04/25/2015  CLINICAL DATA:  50 year old female with a history of cholelithiasis EXAM: INTRAOPERATIVE CHOLANGIOGRAM TECHNIQUE: Cholangiographic images from the C-arm fluoroscopic device were submitted for interpretation post-operatively. Please see the procedural report for the amount of contrast and the fluoroscopy time utilized. COMPARISON:  Ultrasound 04/23/2015 FINDINGS: Surgical instruments project over the upper abdomen. There is cannulation of the cystic duct/gallbladder neck,  with antegrade infusion of contrast. Caliber of the extrahepatic ductal system within normal limits. Low insertion of the cystic duct. No large filling defect identified. Free flow of contrast across the ampulla. IMPRESSION: Intraoperative cholangiogram demonstrates extrahepatic biliary ducts of unremarkable caliber, with no large filling defect identified. Free flow of contrast across the ampulla. Please refer to the dictated operative report for full details of intraoperative findings and procedure Signed, Yvone NeuJaime S. Loreta AveWagner, DO [facility] Electronically Signed   By: Gilmer MorJaime  Wagner D.O.   On: 04/25/2015 10:38   [place] Abdomen Limited Ruq  04/23/2015  CLINICAL DATA:  One day history of right upper quadrant pain with vomiting EXAM: US ABDOMEN LIMITED - RIGHT UPPER QUADRANT COMPARISON:  None. FINDINGS: Gallbladder: There is a calculus measuring 1.6 cm in length which is noted in the neck of the gallbladder and appears adherent. This calculus does shadow. There is no gallbladder wall thickening or pericholecystic fluid. No sonographic Murphy sign  noted by sonographer. Common bile duct: Diameter: 3 mm. There is no intrahepatic or extrahepatic biliary duct dilatation. Liver: No focal lesion identified. Within normal limits in parenchymal echogenicity. IMPRESSION: Calculus adherent in the neck of the gallbladder. Study otherwise unremarkable. Electronically Signed   By: Bretta BangWilliam  Woodruff III M.D.   On: 04/23/2015 21:36    Microbiology: No results found for this or any previous visit (from the past 240 hour(s)).   Labs: Basic Metabolic Panel:  Recent Labs Lab 04/23/15 1703 04/24/15 0824 04/25/15 0621 04/26/15 0418  NA 138 139 140 140  K 4.4 3.6 3.4* 3.8  CL 100* 107 105 108  CO2 25 22 26 24   GLUCOSE 177* 153* 99 125*  BUN 12 8 7 6   CREATININE 0.77 0.68 0.77 0.75  CALCIUM 9.8 7.8* 8.5* 8.2*   Liver Function Tests:  Recent Labs Lab 04/23/15 1703 04/24/15 0824  AST 17 14*  ALT 18 13*  ALKPHOS 84 61  BILITOT 1.0 0.8  PROT 7.1 5.4*  ALBUMIN 4.3 3.0*    Recent Labs Lab 04/23/15 1703  LIPASE 20   No results for input(s): AMMONIA in the last 168 hours. CBC:  Recent Labs Lab 04/23/15 1703 04/24/15 0824 04/25/15 0621  WBC 13.1* 14.8* 10.2  HGB 14.3 11.7* 11.2*  HCT 41.9 34.8* 32.1*  MCV 85.7 85.7 87.0  PLT 250 178 170   Cardiac Enzymes: No results for input(s): CKTOTAL, CKMB, CKMBINDEX, TROPONINI in the last 168 hours. BNP: BNP (last 3 results) No results for input(s): BNP in the last 8760 hours.  ProBNP (last 3 results) No results for input(s): PROBNP in the last 8760 hours.  CBG:  Recent Labs Lab 04/25/15 0612 04/25/15 1153 04/25/15 1629 04/25/15 2142 04/26/15 0609  GLUCAP 99 95 99 130* 111*

## 2015-04-26 NOTE — Progress Notes (Signed)
2 Days Post-Op  Subjective: No n/v. Tolerating diet. Ambulating. Still having RUQ pain  Objective: Vital signs in last 24 hours: Temp:  [98.7 F (37.1 C)-99.5 F (37.5 C)] 99.3 F (37.4 C) (04/13 0904) Pulse Rate:  [67-82] 70 (04/13 0904) Resp:  [16-20] 18 (04/13 0904) BP: (88-113)/(53-64) 109/64 mmHg (04/13 0904) SpO2:  [93 %-99 %] 99 % (04/13 0904) Last BM Date: 04/23/15  Intake/Output from previous day:   Intake/Output this shift:    Alert, nontoxic, not ill appearing. Looks more comfortable today Chest symmetric, no accessory muscle use Soft, incisions c/d/i, approp mild TTP. No jaundice no edema  Lab Results:   Recent Labs  04/24/15 0824 04/25/15 0621  WBC 14.8* 10.2  HGB 11.7* 11.2*  HCT 34.8* 32.1*  PLT 178 170   BMET  Recent Labs  04/25/15 0621 04/26/15 0418  NA 140 140  K 3.4* 3.8  CL 105 108  CO2 26 24  GLUCOSE 99 125*  BUN 7 6  CREATININE 0.77 0.75  CALCIUM 8.5* 8.2*   PT/INR  Recent Labs  04/24/15 0824  LABPROT 14.0  INR 1.06   ABG No results for input(s): PHART, HCO3 in the last 72 hours.  Invalid input(s): PCO2, PO2  Studies/Results: Dg Cholangiogram Operative  04/25/2015  CLINICAL DATA:  50 year old female with a history of cholelithiasis EXAM: INTRAOPERATIVE CHOLANGIOGRAM TECHNIQUE: Cholangiographic images from the C-arm fluoroscopic device were submitted for interpretation post-operatively. Please see the procedural report for the amount of contrast and the fluoroscopy time utilized. COMPARISON:  Ultrasound 04/23/2015 FINDINGS: Surgical instruments project over the upper abdomen. There is cannulation of the cystic duct/gallbladder neck, with antegrade infusion of contrast. Caliber of the extrahepatic ductal system within normal limits. Low insertion of the cystic duct. No large filling defect identified. Free flow of contrast across the ampulla. IMPRESSION: Intraoperative cholangiogram demonstrates extrahepatic biliary ducts of  unremarkable caliber, with no large filling defect identified. Free flow of contrast across the ampulla. Please refer to the dictated operative report for full details of intraoperative findings and procedure Signed, Yvone Neu. Loreta Ave, DO [facility] Electronically Signed   By: Gilmer Mor D.O.   On: 04/25/2015 10:38    Anti-infectives: Anti-infectives    Start     Dose/Rate Route Frequency Ordered Stop   04/24/15 2315  metroNIDAZOLE (FLAGYL) IVPB 500 mg     500 mg 100 mL/hr over 60 Minutes Intravenous Every 8 hours 04/24/15 1516 04/24/15 2309   04/24/15 1700  cefTRIAXone (ROCEPHIN) 1 g in dextrose 5 % 50 mL IVPB     1 g 100 mL/hr over 30 Minutes Intravenous  Once 04/24/15 1516 04/24/15 1744   04/24/15 0800  cefTRIAXone (ROCEPHIN) 1 g in dextrose 5 % 50 mL IVPB  Status:  Discontinued     1 g 100 mL/hr over 30 Minutes Intravenous Every 24 hours 04/24/15 0730 04/24/15 1516   04/24/15 0715  metroNIDAZOLE (FLAGYL) IVPB 500 mg  Status:  Discontinued     500 mg 100 mL/hr over 60 Minutes Intravenous Every 8 hours 04/24/15 0712 04/24/15 1516      Assessment/Plan: s/p Procedure(s): LAPAROSCOPIC CHOLECYSTECTOMY WITH INTRAOPERATIVE CHOLANGIOGRAM (N/A)  Vitals stable Looks good She had infected GB so expect there to be a little bit more c/o of rt sided discomfort. No fever. No tachycardia Believe she is safe for dc Discussed dc instructions  Taylor Sella. Andrey Campanile, MD, FACS General, Bariatric, & Minimally Invasive Surgery [facility]   LOS:  2 days    Atilano InaWILSON,Taylor Harting M 04/26/2015

## 2015-04-26 NOTE — Care Management Note (Signed)
Case Management Note  Patient Details  Name: Taylor Gibbs MRN: [id number] Date of Birth: [birth year]  Subjective/Objective:                    Action/Plan: Patient discharging home with self care. No further needs per CM.   Expected Discharge Date:                  Expected Discharge Plan:  Home/Self Care  In-House Referral:     Discharge planning Services     Post Acute Care Choice:    Choice offered to:     DME Arranged:    DME Agency:     HH Arranged:    HH Agency:     Status of Service:  Completed, signed off  Medicare Important Message Given:    Date Medicare IM Given:    Medicare IM give by:    Date Additional Medicare IM Given:    Additional Medicare Important Message give by:     If discussed at Long Length of Stay Meetings, dates discussed:    Additional Comments:  Kermit BaloKelli F Clarita Mcelvain, RN 04/26/2015, 9:21 AM

## 2017-02-24 IMAGING — RF DG CHOLANGIOGRAM OPERATIVE
1 series · 2 of 2 positions shown · non-contrast
Comparison: Ultrasound 04/23/2015

CLINICAL DATA: 49-year-old female with a history of cholelithiasis

EXAM:
INTRAOPERATIVE CHOLANGIOGRAM
TECHNIQUE: Cholangiographic images from the C-arm fluoroscopic device were
submitted for interpretation post-operatively. Please see the
procedural report for the amount of contrast and the fluoroscopy
time utilized.

[Series 1: run · 2 of 2 slices shown]
[im 1/2]
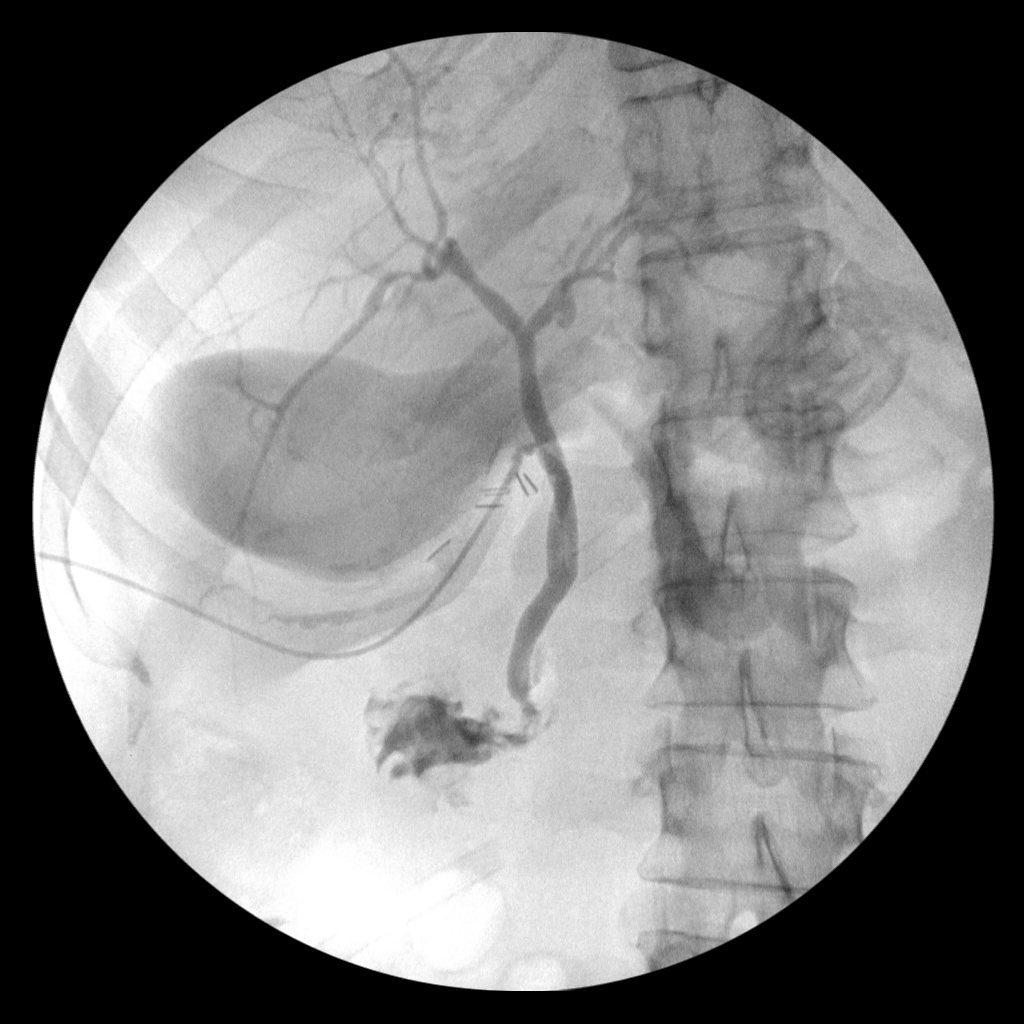
[im 2/2]
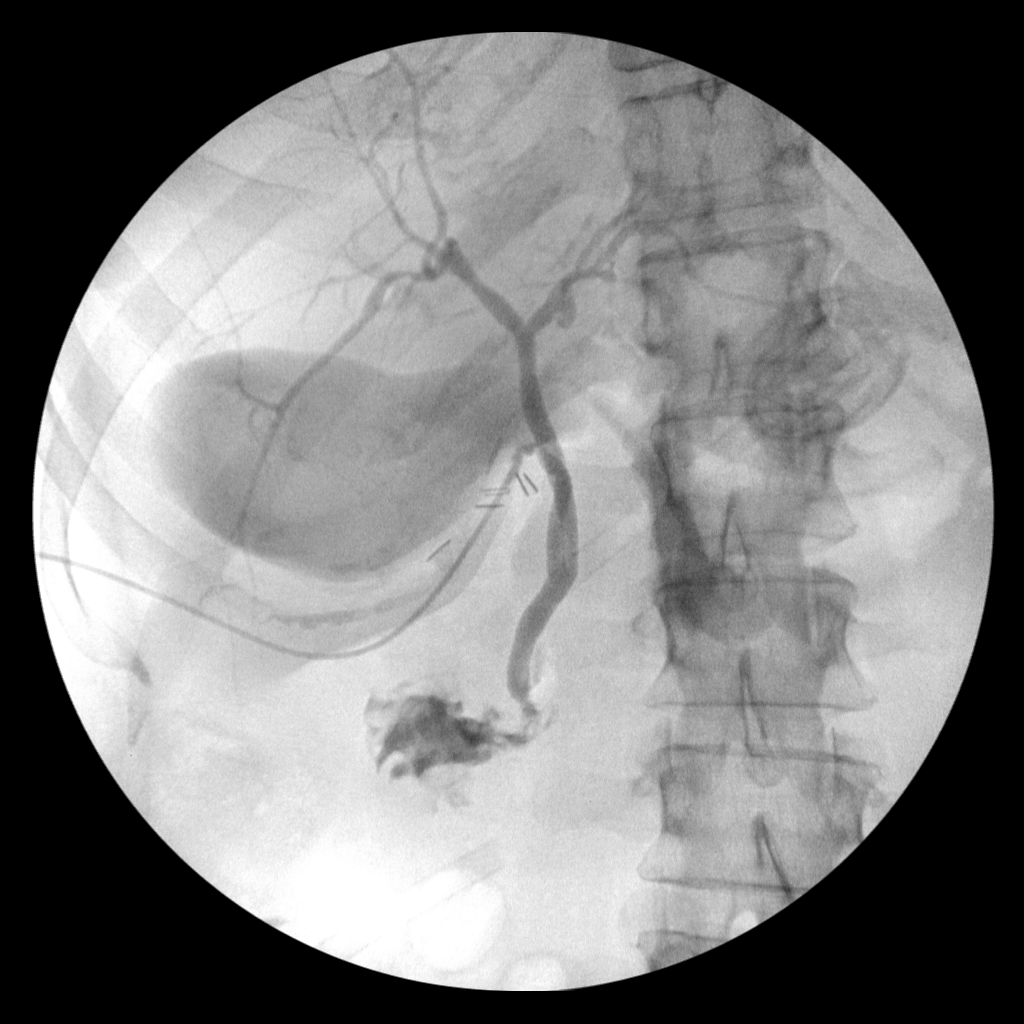

[2 of 2 positions shown; findings below may reference images not displayed]

FINDINGS: Surgical instruments project over the upper abdomen.

There is cannulation of the cystic duct/gallbladder neck, with
antegrade infusion of contrast. Caliber of the extrahepatic ductal
system within normal limits. Low insertion of the cystic duct.

No large filling defect identified.

Free flow of contrast across the ampulla.
IMPRESSION: Intraoperative cholangiogram demonstrates extrahepatic biliary ducts
of unremarkable caliber, with no large filling defect identified.
Free flow of contrast across the ampulla.

Please refer to the dictated operative report for full details of
intraoperative findings and procedure

## 2017-12-07 IMAGING — US US ABDOMEN LIMITED
1 series · 14 of 25 positions shown · non-contrast
Comparison: None.

CLINICAL DATA: One day history of right upper quadrant pain with
vomiting

EXAM:
US ABDOMEN LIMITED - RIGHT UPPER QUADRANT

[Series 1: us abdomen limited · 0.25mm/px · 14 of 47 slices shown]
[im 1/47]
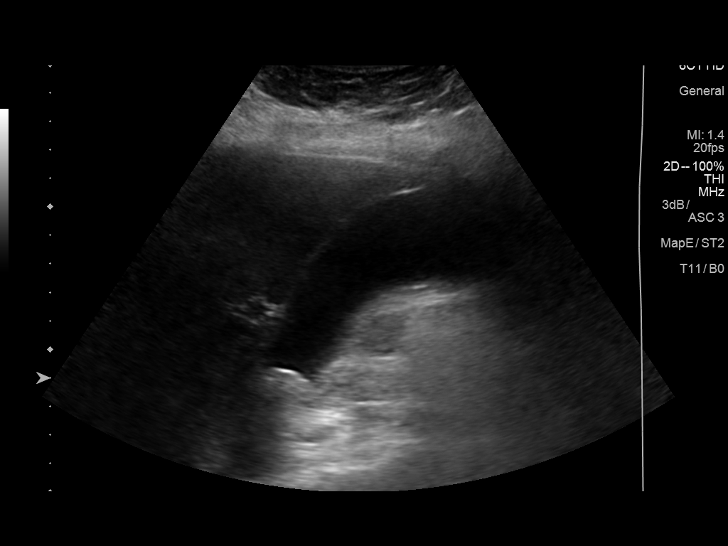
[im 4/47]
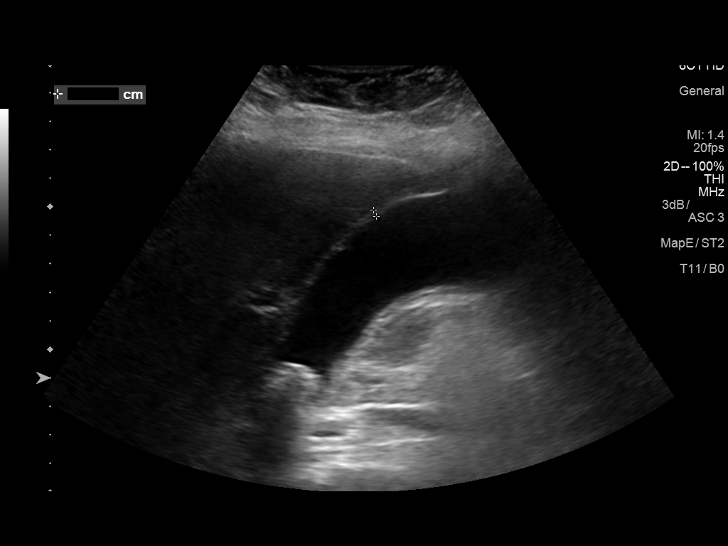
[im 8/47]
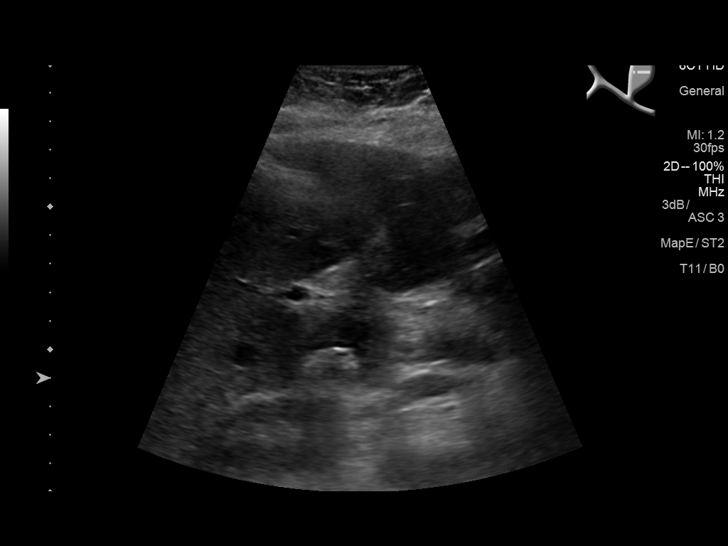
[im 12/47]
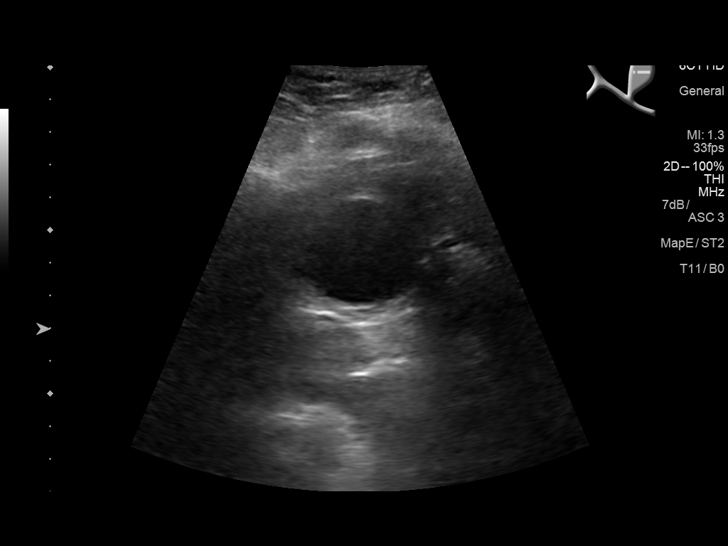
[im 16/47]
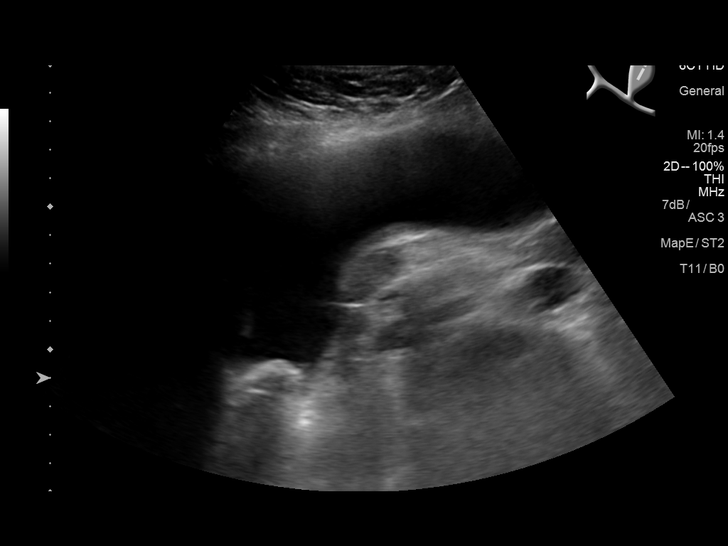
[im 18/47]
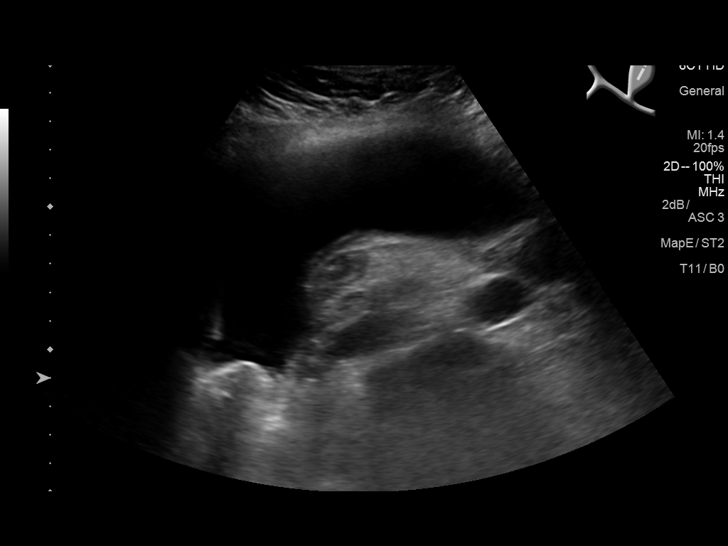
[im 22/47]
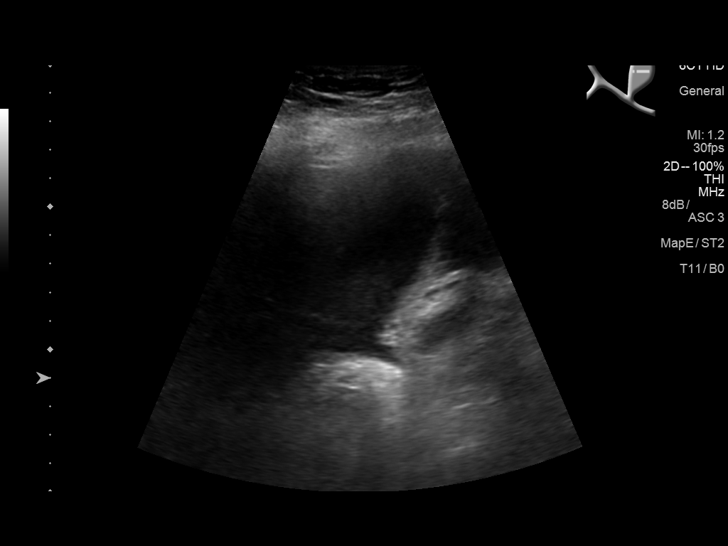
[im 25/47]
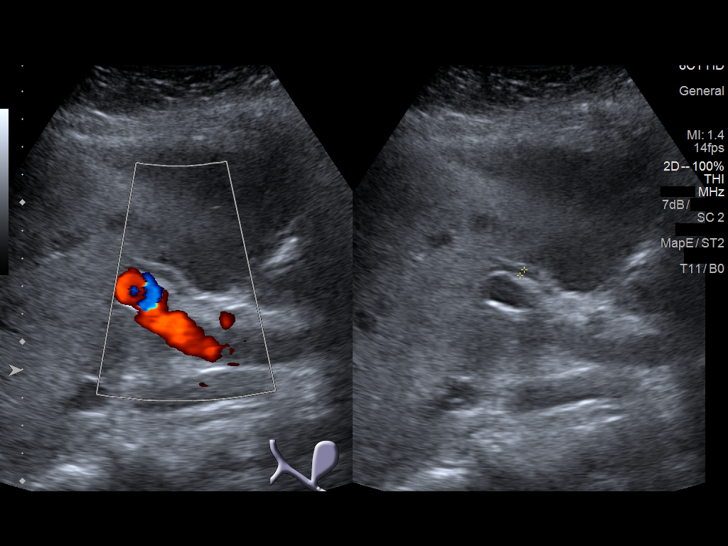
[im 29/47]
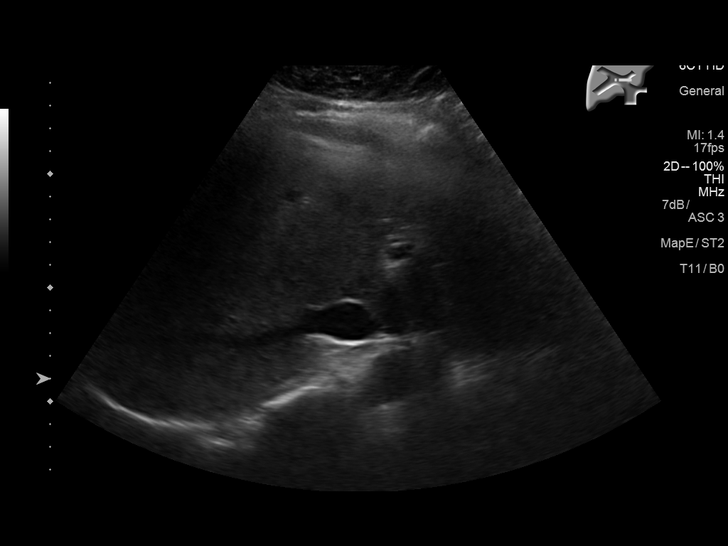
[im 31/47]
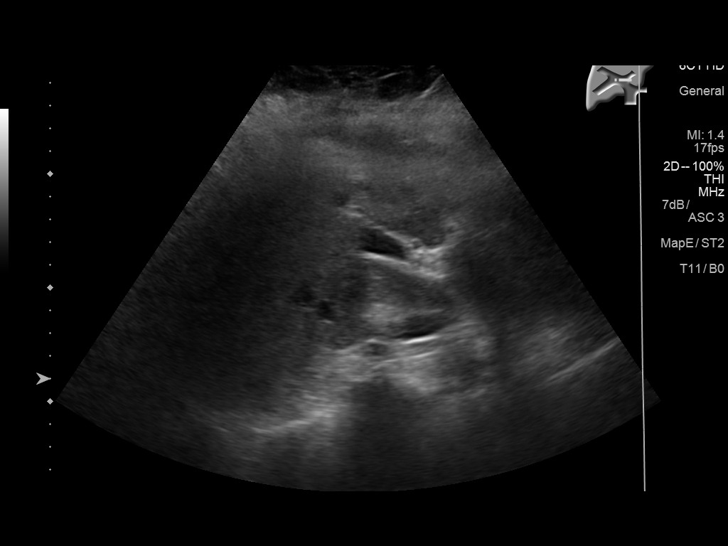
[im 35/47]
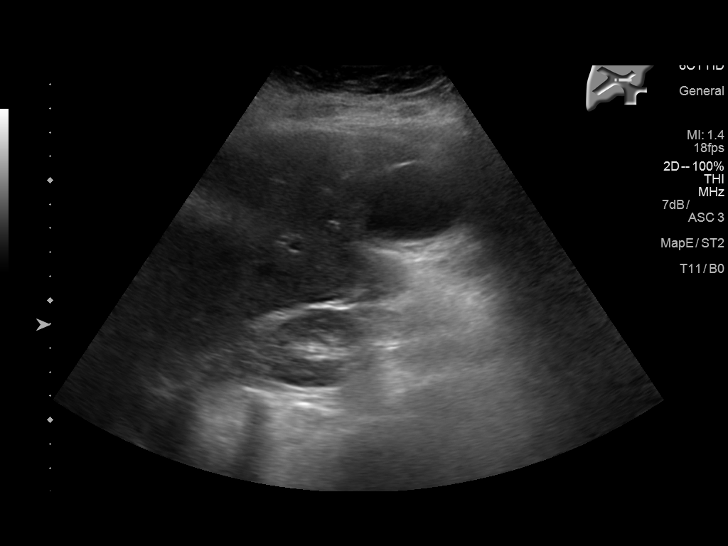
[im 39/47]
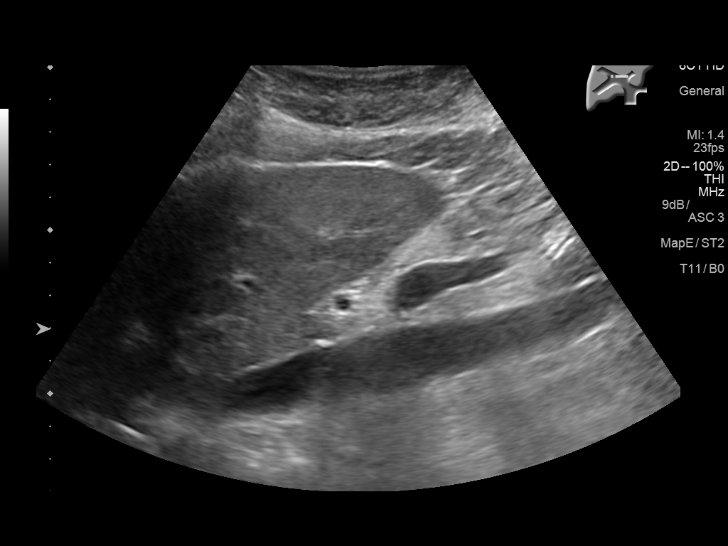
[im 43/47]
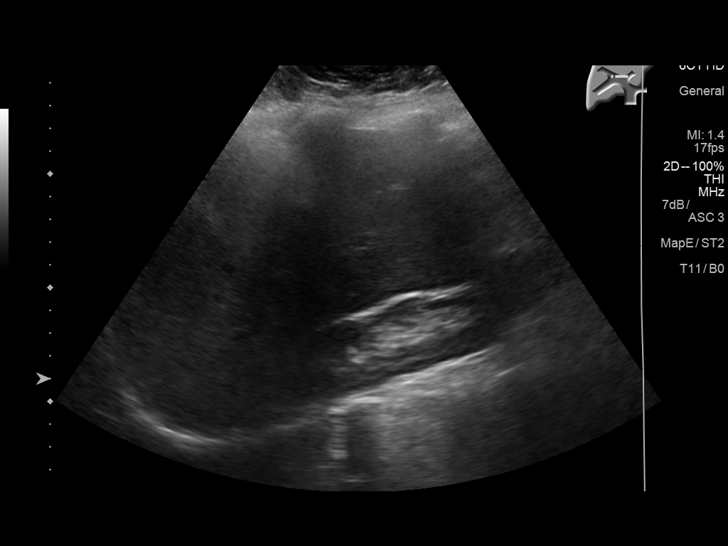
[im 47/47]
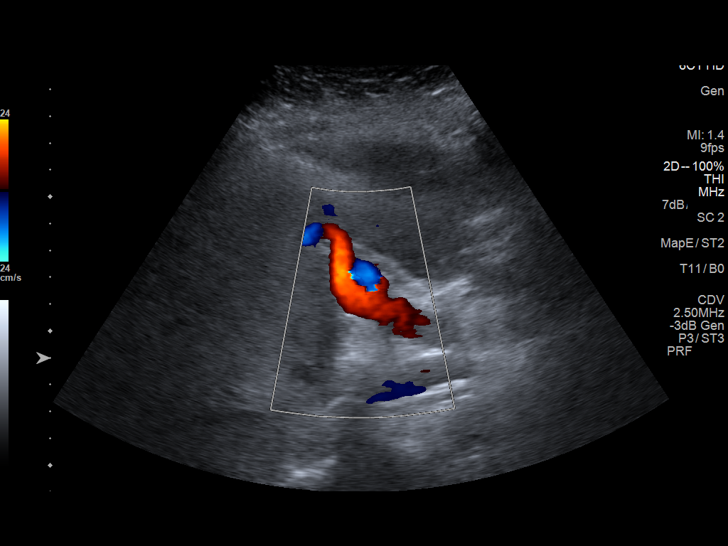

[14 of 25 positions shown; findings below may reference images not displayed]

FINDINGS: Gallbladder:

There is a calculus measuring 1.6 cm in length which is noted in the
neck of the gallbladder and appears adherent. This calculus does
shadow. There is no gallbladder wall thickening or pericholecystic
fluid. No sonographic Murphy sign noted by sonographer.

Common bile duct:

Diameter: 3 mm. There is no intrahepatic or extrahepatic biliary
duct dilatation.

Liver:

No focal lesion identified. Within normal limits in parenchymal
echogenicity.
IMPRESSION: Calculus adherent in the neck of the gallbladder. Study otherwise
unremarkable.
